# Patient Record
Sex: Male | Born: 1990 | Race: White | Hispanic: No | Marital: Single | Smoking: Former smoker
Health system: Southern US, Academic
[De-identification: ages and names within clinical notes are randomized; demographics above are authoritative.]

## PROBLEM LIST (undated history)

## (undated) DIAGNOSIS — F909 Attention-deficit hyperactivity disorder, unspecified type: Secondary | ICD-10-CM

---
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
## 2010-03-14 ENCOUNTER — Encounter (FREE_STANDING_LABORATORY_FACILITY)
Admit: 2010-03-14 | Discharge: 2010-03-14 | Disposition: A | Discharge status: Home / Self Care | Payer: 59 | Attending: Family Medicine | Admitting: Family Medicine

## 2010-03-14 ENCOUNTER — Ambulatory Visit (INDEPENDENT_AMBULATORY_CARE_PROVIDER_SITE_OTHER): Payer: 59

## 2010-03-14 ENCOUNTER — Encounter (INDEPENDENT_AMBULATORY_CARE_PROVIDER_SITE_OTHER): Payer: Self-pay

## 2010-03-14 VITALS — HR 70 | Temp 98.6°F | Resp 20 | Wt 169.5 lb

## 2010-03-14 DIAGNOSIS — J029 Acute pharyngitis, unspecified: Secondary | ICD-10-CM | POA: Diagnosis present

## 2010-03-14 MED ORDER — PENICILLIN V POTASSIUM 500 MG TABLET
500.00 mg | ORAL_TABLET | Freq: Two times a day (BID) | ORAL | Status: DC
Start: 2010-03-14 — End: 2010-04-09

## 2010-03-14 NOTE — Patient Instructions (Signed)
Pharyngitis (Viral and Bacterial)  Pharyngitis is soreness (inflammation) or infection of the pharynx. It is also called a sore throat.  CAUSES  Most sore throats are caused by viruses and are part of a cold. However, some sore throats are caused by strep and other bacteria. Sore throats can also be caused by post nasal drip from draining sinuses, allergies and sometimes from sleeping with an open mouth. Infectious sore throats can be spread from person to person by coughing, sneezing and sharing cups or eating utensils.  TREATMENT  Sore throats that are viral usually last 3-4 days. Viral illness will get better without medications (antibiotics). Strep throat and other bacterial infections will usually begin to get better about 24-48 hours after you begin to take antibiotics.  HOME CARE INSTRUCTIONS   If the caregiver feels there is a bacterial infection or if there is a positive strep test, they will prescribe an antibiotic. The full course of antibiotics must be taken!! If the full course of antibiotic is not taken, you or your child may become ill again. If you or your child has strep throat and do not finish all of the medication, serious heart or kidney diseases may develop.    Drink enough water and fluids to keep your urine clear or pale yellow.    Only take over-the-counter or prescription medicines for pain, discomfort or fever as directed by your caregiver.    Get lots of rest.    Gargle with salt water ( tsp. of salt in a glass of water) as often as every 1-2 hours as you need for comfort.    Hard candies may soothe the throat if individual is not at risk for choking. Throat sprays or lozenges may also be used.   SEEK MEDICAL CARE IF:   Large, tender lumps in the neck develop.    A rash develops.    Green, yellow-brown or bloody sputum is coughed up.    You or your child has an oral temperature above 102 F (38.9 C).     Your baby is older than 3 months with a rectal temperature of 100.5 F (38.1 C) or higher for more than 1 day.   SEEK IMMEDIATE MEDICAL CARE IF:   A stiff neck develops.    You or your child are drooling or unable to swallow liquids.    You or your child are vomiting, unable to keep medications or liquids down.    You or your child has severe pain, unrelieved with recommended medications.    You or your child are having difficulty breathing (not due to stuffy nose).    You or your child are unable to fully open your mouth.    You or your child develop redness, swelling, or severe pain anywhere on the neck.    You or your child has an oral temperature above 102 F (38.9 C), not controlled by medicine.    Your baby is older than 3 months with a rectal temperature of 102 F (38.9 C) or higher.    Your baby is 29 months old or younger with a rectal temperature of 100.4 F (38 C) or higher.   MAKE SURE YOU:     Understand these instructions.    Will watch your condition.    Will get help right away if you are not doing well or get worse.   Document Released: 01/17/2005 Document Re-Released: 07/07/2009  [facility] Patient Information 2011 [place], [place].    Orders Placed This Encounter   .  Throat Culture   . Rapid Strep   . penicillin V potassium (VEETID) 500 mg Oral Tablet   . RETURN TO WORK/SCHOOL        ________________________________________________________________________  Short Term Disability and Family Medical Leave Act  [facility] does NOT provide assistance with any disability applications.  If you feel your medical condition requires you to be on disability, you will need to follow up with  Your primary care physician or a specialist.  We apologize for any inconvenience.    For Medication Prescribed by [facility]:  As an Urgent Care facility, our clinic does NOT offer prescription refills over the telephone.     If you need more of the medication one of our medical providers prescribed, you will  Either need to be re-evaluated by [place] or see your primary care physician.    ________________________________________________________________________      It is very important that we have a phone number that is the single best way to contact you in the event that we become aware of important clinical information or concerns after your discharge.  If the phone number you provided at registration is NOT this number you should inform staff and registration prior to leaving.      Your treatment and evaluation today was focused on identifying and treating potentially emergent conditions based on your presenting signs, symptoms, and history.  The resulting initial clinical impression and treatment plan is not intended to be definitive or a substitute for a full physical examination and evaluation by your primary care provider.  If your symptoms persist, worsen, or you develop any new or concerning symptoms, you need to be evaluated.      If you received x-rays during your visit, be aware that the final and formal interpretation of those films by a radiologist may occur after your discharge.  If there is a significant discrepancy identified after your discharge, we will contact you at th telephone number provided at registration.      If you received a pelvic exam, you may have cultures pending for sexually transmitted diseases.  Positive cultures are reported to the [facility] Department of Health as required by state law.  You should be contacted if you cultures are positive.  We will not contact you if they are negative.  You may contact the Health Information Management Office of [facility] to get a copy of your results.  You did NOT receive a PAP smear (the screening test for cervical).  This specific test for women is best performed by your gynecologist or primary care provider when indicated.       If you are over 49 year old, we cannot discuss your personal health information with a parent, spouse, family member, or anyone else without your express consent.  This does not include those who have legitimate access to your records and information to assist in your care under the provisions of HIPAA [facility] Portability and Accountability Act) law, or those to whom you have previously given express written consent to do so, such a legal guardian or Power of [place].      You may have received medication that may cause you to feel drowsy and/or light headed for several hours.  You may even experience some amnesia of your stay.  You should avoid operating a motor vehicle or performing any activity requiring complete alertness or coordination until you feel fully awake (approximately 24-48 hours).  Avoid alcoholic beverages.  You may  also have a dry mouth for several hours.  This is a normal side effect and will disappear as the effects of the medication wear off.      Instructions discussed with patient upon discharge by clinical staff with all questions answered.  Please call [facility] [phone] if any further questions.  Go immediately to the emergency department if any concern or worsening symptoms.        Allesandra Huebsch Clent Jacks, DO 03/14/2010, 11:52 AM

## 2010-03-14 NOTE — Progress Notes (Signed)
History of Present Illness: Terrance Irwin is a 20 y.o. male who presents to the Urgent Care today of Sore Throat   for two days, described as left side throat pain hurts to talk.  Also, patient has associated runny nose. Took mucinex D and alka seltzer.    Gets strep every year this time.  + sick contacts with viral illness. Denies cough or SOB        I reviewed and confirmed the patient's past medical history taken by the nurse or medical assistant (see cosignature of written/scanned note) with the addition of the following:    Past Medical History:      Past Medical History   Diagnosis Date   . ADHD (attention deficit hyperactivity disorder)    . Migraine Headaches 05/13/2002   . Headaches 01/07/2000         Past Surgical History:      History reviewed.  No pertinent past surgical history.    Allergies:      No Known Allergies    Problem List:    Patient Active Problem List   Diagnoses Code   . Migraine Headaches 346.10   . ADHD (Attention Deficit Hyperactivity Disorder) 314.23M   . 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 Nicotine Dependence 305.1A   . Cannabis Abuse 305.20P          Medications:      Current outpatient prescriptions   Medication Sig Dispense Refill   . penicillin V potassium (VEETID) 500 mg Oral Tablet take 1 Tab by mouth Twice daily.  20 Tab  0   . amphetamine-dextroamphetamine (ADDERALL XR) 20 mg Cp24 take 1 Cap by mouth Once a day.   1 month supply  0         Social History:      History   Social History   . Marital Status: Single     Spouse Name: N/A     Number of Children: N/A   . Years of Education: N/A   Social History Main Topics   . Smoking status: Current Everyday Smoker -- 0.5 packs/day   . Smokeless tobacco: Not on file   . Alcohol Use: No   . Drug Use: Yes      marijuana   . Sexually Active: Not on file   Other Topics Concern   . Not on file   Social History Narrative   . No narrative on file         Family History: No significant family history.    Family History    Problem Relation Age of Onset   . Hypertension     . Cancer           Review of Systems:  General: no fever, no myalgias and no fatigue  ENT:  no otalgia right and no otalgia left  Pulmonary: no dry cough    Physical Exam:  Vital signs:   Filed Vitals:    03/14/10 1129   Pulse: 70   Temp: 37 C (98.6 F)   Resp: 20   Weight: 76.9 kg (169 lb 8.5 oz)   SpO2: 98%       General: Well appearing and No acute distress  Eyes: Normal lids/lashes and normal conjunctiva  ENT: normal EAC's, normal TM's, MMM and mild pharyngeal erythema and PND  Neck: supple and no lymphadenopathy  Pulmonary: clear to auscultation bilaterally, no wheezes, no rales, no rhonchi and overall decreased breath sounds  Cardiovascular: regular rate/rhythm, normal  S1/S2 and no murmur/rub/gallop  Skin: warm/dry and no rash  Psychiatric: appropriate affect and behavior        Course: Pt discharged in good condition.    Point-of-care testing:          Rapid Strep: Negative  POCT Results:           Rapid Strep: Negative  Throat culture sent: Yes  Initials: KC      Glorene Leitzke Clent Jacks, DO 03/14/2010, 12:02 PM      Radiography:     Electrocardiography:     Differential diagnosis: viral, sinus drainage, abscess, strep    Assessment: 1. Pharyngitis (89Y)        Plan:  Orders Placed This Encounter   . Throat Culture   . Rapid Strep   . penicillin V potassium (VEETID) 500 mg Oral Tablet   . RETURN TO WORK/SCHOOL     Ibuprofen 600mg  prn. Advised to quit smoking           Plan was discussed and patient verbalized understanding.  If symptoms are worsening or not improving the patient should return to the Urgent Care for further evaluation.    Nichole Neyer Clent Jacks, DO 03/14/2010, 11:59 AM

## 2010-03-16 LAB — THROAT CULTURE, BETA HEMOLYTIC STREPTOCOCCUS

## 2010-04-09 ENCOUNTER — Encounter (HOSPITAL_COMMUNITY): Payer: Self-pay

## 2010-04-09 ENCOUNTER — Emergency Department (EMERGENCY_DEPARTMENT_HOSPITAL): Payer: 59

## 2010-04-09 ENCOUNTER — Emergency Department
Admission: EM | Admit: 2010-04-09 | Discharge: 2010-04-09 | Disposition: A | Discharge status: Home / Self Care | Payer: 59 | Attending: Emergency Medicine-WVUH | Admitting: Emergency Medicine-WVUH

## 2010-04-09 DIAGNOSIS — F17200 Nicotine dependence, unspecified, uncomplicated: Secondary | ICD-10-CM | POA: Insufficient documentation

## 2010-04-09 DIAGNOSIS — S61209A Unspecified open wound of unspecified finger without damage to nail, initial encounter: Secondary | ICD-10-CM | POA: Insufficient documentation

## 2010-04-09 DIAGNOSIS — W268XXA Contact with other sharp object(s), not elsewhere classified, initial encounter: Secondary | ICD-10-CM | POA: Insufficient documentation

## 2010-04-09 MED ORDER — CEPHALEXIN 500 MG CAPSULE
500.00 mg | ORAL_CAPSULE | Freq: Two times a day (BID) | ORAL | Status: DC
Start: 2010-04-09 — End: 2011-09-29
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
## 2010-04-09 NOTE — ED Nurses Note (Signed)
Pt was discharged by pa.
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
## 2011-07-15 ENCOUNTER — Ambulatory Visit (HOSPITAL_COMMUNITY): Payer: Self-pay | Admitting: Psychiatry

## 2011-07-15 NOTE — Telephone Encounter (Signed)
Message copied by Creta Levin on Fri Jul 15, 2011  3:40 PM  ------       Message from: Purvis Sheffield [facility])       Created: Fri Jul 15, 2011  2:24 PM         >> Mallie Snooks 07/15/2011 11:14 AM       Melanie, patient is former patient of Dr. Okey Dupre.  He is wanting to get back in to be seen for ADHD & to get back on medication.  Mom is [employer] employee.  I've updated his information.  Thank you

## 2011-07-15 NOTE — Telephone Encounter (Signed)
 Case manager called patient and scheduled him for an intake appointment on Monday 08/08/11 at 1:45 pm with Dr. Marliss.  Case manager mailed patient a new patient letter and appointment card.  07/15/2011  Sheneika Walstad Lorraine Calloway Andrus, DISCHARGE PL

## 2011-07-20 ENCOUNTER — Ambulatory Visit (HOSPITAL_COMMUNITY): Payer: Self-pay | Admitting: Psychiatry

## 2011-07-20 NOTE — Telephone Encounter (Signed)
Case manager called patient and rescheduled him for a sooner intake appointment on Tuesday 08/02/11 at 9:00 am with Dr. Claudia Desanctis.  Patient asked if there were any sooner appointments and case manager told him there were not any appointments available in June unless someone cancels.  Case manager said she would call him if someone were to cancel an appointment before 08/02/11.  Case manager mailed patient a new appointment card and new patient letter.  07/20/2011  Aaralynn Shepheard Lorraine Vannesa Abair, DISCHARGE PL

## 2011-07-20 NOTE — Telephone Encounter (Signed)
 Message copied by BERNADENE PLEASANT JEAN on Wed Jul 20, 2011 10:37 AM  ------       Message from: TANDA POOL       Created: Tue Jul 19, 2011  1:58 PM         >> POOL TANDA 07/19/2011 01:58 PM       Patient is currently scheduled for 7/8 but is calling to see if he can get in any sooner. He's having anger issues and states he 'goes off on everyone.  He's going through a lot of stuff at the moment and feels he needs seen. He believes Andrea had helped him get past the long waiting period. I told hm this was most likely the soonest you had but said I'd send a message.

## 2011-08-02 ENCOUNTER — Encounter (FREE_STANDING_LABORATORY_FACILITY)
Admit: 2011-08-02 | Discharge: 2011-08-02 | Disposition: A | Discharge status: Home / Self Care | Payer: 59 | Attending: Psychiatry | Admitting: Psychiatry

## 2011-08-02 ENCOUNTER — Ambulatory Visit (HOSPITAL_COMMUNITY): Payer: 59 | Admitting: Psychiatry

## 2011-08-02 ENCOUNTER — Ambulatory Visit (INDEPENDENT_AMBULATORY_CARE_PROVIDER_SITE_OTHER): Payer: 59 | Admitting: Psychiatry

## 2011-08-02 VITALS — BP 121/72 | HR 70 | Ht 68.15 in | Wt 180.8 lb

## 2011-08-02 DIAGNOSIS — F909 Attention-deficit hyperactivity disorder, unspecified type: Secondary | ICD-10-CM | POA: Diagnosis present

## 2011-08-02 LAB — URINALYSIS, MACROSCOPIC AND MICROSCOPIC
BILIRUBIN: NEGATIVE
BLOOD: NEGATIVE
GLUCOSE: NEGATIVE mg/dL
KETONES: NEGATIVE mg/dL
LEUKOCYTES: NEGATIVE
NITRITE: NEGATIVE
PH URINE: 7 (ref 5.0–8.0)
RBC'S: NONE SEEN /HPF (ref 0–4)
SPECIFIC GRAVITY, URINE: 1.02 (ref 1.005–1.030)
UROBILINOGEN: NORMAL mg/dL
WBC'S: <1 /HPF (ref 0–1)

## 2011-08-02 LAB — ALT (SGPT): ALT (SGPT): 28 U/L (ref 7–55)

## 2011-08-02 LAB — AST (SGOT): AST (SGOT): 33 U/L (ref 8–48)

## 2011-08-02 LAB — CANNABINOID, UR (CRH): CANNABINOID, URINE: POSITIVE — AB

## 2011-08-02 LAB — ETHANOL, SERUM: ETHANOL, SERUM: NOT DETECTED mg/dL

## 2011-08-02 MED ORDER — DEXTROAMPHETAMINE-AMPHETAMINE ER 20 MG 24HR CAPSULE,EXTEND RELEASE
20.00 mg | ORAL_CAPSULE | Freq: Every morning | ORAL | Status: DC
Start: 2011-08-02 — End: 2011-08-18

## 2011-08-02 NOTE — Progress Notes (Signed)
08/02/11 1500   Drug Screen Results   Amphetamine (AMP) Negative   Barbiturates (BAR) Negative   Bupenorphine (BUP) Negative   Benzodiazepine (BZO) Negative   Cocaine (COC) Negative   Methamphetamine (mAMP) Negative   Methadone (MTD) Negative   Opiates (OPI) Negative   Oxycodone (OXY) Negative   Marijuana (THC) Positive   Temperature within range? yes   Observed no   Tester cschaefer   Witness dray   Physician hartzell/berry   Lot # [id number]   Expiration Date 10/14   Internal Control Valid yes   Initials cs
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
## 2011-08-05 LAB — ADULTERANTS SURVEY
CREATININE: 173.8
OXIDANTS: NEGATIVE
PH: 8.1
SPECIFIC GRAVITY: 1.016

## 2011-08-06 LAB — CARBOXY - TETRAHYDROCANNABINOL (THC) CONFIRMATION, URINE
IMMUNOASSAY SCREEN: POSITIVE
INTERPRETATION: POSITIVE
THC CARBOXYLIC ACID-BY GC/MS: 43

## 2011-08-08 ENCOUNTER — Ambulatory Visit (HOSPITAL_COMMUNITY): Payer: 59 | Admitting: Psychiatry

## 2011-08-18 ENCOUNTER — Ambulatory Visit (HOSPITAL_COMMUNITY): Payer: Self-pay | Admitting: Psychiatry

## 2011-08-18 ENCOUNTER — Ambulatory Visit (INDEPENDENT_AMBULATORY_CARE_PROVIDER_SITE_OTHER): Payer: 59 | Admitting: Psychiatry

## 2011-08-18 VITALS — BP 120/60 | HR 60 | Wt 181.2 lb

## 2011-08-18 MED ORDER — DEXTROAMPHETAMINE-AMPHETAMINE ER 20 MG 24HR CAPSULE,EXTEND RELEASE
20.0000 mg | ORAL_CAPSULE | Freq: Every morning | ORAL | Status: DC
Start: 2011-08-18 — End: 2011-10-06

## 2011-08-18 NOTE — Telephone Encounter (Signed)
Message copied by Cherylann Banas on Thu Aug 18, 2011  2:45 PM  ------       Message from: ENOFF, LINDA       Created: Thu Aug 18, 2011  2:35 PM         >> Bonita Quin ENOFF 08/18/2011 02:35 PM       Dr. Claudia Desanctis,               Pt called and said that the pharmacy will not fill the script for his adderall because he just got it filled on 7.5.13 - could you call the pharmacy and explain that they were lost and he needs a new refill. ?                   [facility] - [place], [place] - [address]         [address] [facility] [place] [address]         Phone: [phone] Fax: [phone]

## 2011-08-18 NOTE — Progress Notes (Addendum)
 [facility]         Psychiatric Progress Note    Name: Terrance Irwin   MRN: [id number]  DOB: Aug 08, 1990  Date of Service: 08/18/2011    Chief Complaint   Patient presents with   . ADHD   . Substance Abuse        Subjective: Patient presents back to clinic today for follow up of ADHD, seen for intake 2 weeks ago, former patient of Dr. Charlotta with h/o ADHD, ODD, sub abuse. He was started on Adderall XR in accordance with plan to dc THC use and cut down on OH consumption as well. Urine screen was + for THC (level 43) at that time. He reports he has been doing OK but he states he only was able to take the Adderall 3 days and then lost it when he went camping at [facility] with his parents that weekend. He says he noticed an improvement in his focus and felt calmer the days he took it. He denies any more THC use since last appt. He has drank a few days since visit, no more than 1-2 beers per day. 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 Denies med side effects.    Mental Status Exam: Javion Holmer appears in no distress and  stated age with fair hygiene. He is dressed in Transport planner and work clothes. He is oriented fully. Attention is fair. Speech is normal. His nails are noticed to be very short from being bitten. He describes his mood as alright and affect is mildly anxious and mood congruent.  He exhibits no psychomotor changes. He endorses no suicidal ideation and no homicidal ideation. His thought process is linear and perception appears unaltered. Insight is fair.    Neurologic Exam:     Gait: Steady  Sensation: Appears to be grossly intact unless otherwise noted  Motor: Grossly Intact  Orientation: Fully oriented to person, place, time and situation    Vitals:    BP 120/60  Pulse 60  Wt 82.2 kg (181 lb 3.5 oz)      Current Psych Medications: Adderall XR 20 mg  daily    Home medications: Other home medications reviewed and changes noted:     Recent Abnormal or Pertinent Lab/Test Results: URINE DRUG SCREEN reviewed from previous visit as mentioned above    Collateral History Obtained from Family/Friend/Caregiver: NA    Assessment:    Axis I: ADHD, ODD per history, Alcohol  abuse, Cannabis Abuse, rule out anxiety disorder NOS  Axis II: Defer  Axis III: None  Axis IV: poor coping skills; family and relationship stressors  Axis V: GAF 65    Suicide Risk Assessment: Low    Plan:    1. Discussed need to not lose controlled substance script and that I only allow one such occurrence before I will no longer prescribe these substances to a patient. He voiced understanding. He plans to have his mother keep the bottle and dispense to him  2. Continue to encourage abstinence from other substances with that being a pre-requisite to continue treatment with stimulants  3. Expolre possibility of anxiety disorder further  4. RTC 2-3 weeks  5. URINE DRUG SCREEN and Vitals today    -Call with questions or concerns  -Will report to ED or call 911 immediately if develops thoughts of self harm or SI  [name], MD        I saw and examined the patient.  I reviewed the resident's note.  I agree with the findings and plan of care as documented in the resident's note.  Any exceptions/additions are edited/noted.    Lynwood Mages, MD 08/23/2011, 12:51 PM

## 2011-08-18 NOTE — Telephone Encounter (Signed)
Left message with pharmacy to authorize one time early refill of Adderall XR and to notify patient.      Cherylann Banas, MD 08/18/2011, 2:46 PM

## 2011-08-18 NOTE — Progress Notes (Addendum)
08/18/11 1300   Drug Screen Results   Amphetamine (AMP) Negative   Barbiturates (BAR) Negative   Bupenorphine (BUP) Negative   Benzodiazepine (BZO) Negative   Cocaine (COC) Negative   Methamphetamine (mAMP) Negative   Methadone (MTD) Negative   Opiates (OPI) Negative   Oxycodone (OXY) Negative   Marijuana (THC) Negative   Temperature within range? yes   Observed no   Tester cschaefer   Witness lori   Physician hartzell/sullivan   Lot # [id number]   Expiration Date 10/14   Internal Control Valid yes   Initials cs          I saw and examined the patient.  I reviewed the resident's note.  I agree with the findings and plan of care as documented in the resident's note.  Any exceptions/additions are edited/noted.    Renee Pain, MD 08/23/2011, 12:41 PM

## 2011-09-02 ENCOUNTER — Ambulatory Visit (HOSPITAL_COMMUNITY): Payer: Self-pay

## 2011-09-06 ENCOUNTER — Encounter (HOSPITAL_COMMUNITY): Payer: 59 | Admitting: Psychiatry

## 2011-09-09 ENCOUNTER — Encounter (HOSPITAL_COMMUNITY): Payer: 59 | Admitting: Psychiatry

## 2011-09-27 ENCOUNTER — Telehealth (HOSPITAL_COMMUNITY): Payer: Self-pay | Admitting: Psychiatry

## 2011-09-27 NOTE — Telephone Encounter (Signed)
Message copied by Cherylann Banas on Tue Sep 27, 2011  4:20 PM  ------       Message from: Syliva Overman       Created: Tue Sep 27, 2011  1:05 PM       Regarding: RE: Appointment       Contact: [phone]         I think it would be helpful to tell the patient that yourself when you call him.               Thanks       RJW       ----- Message -----          From: Cherylann Banas, MD          Sent: 09/27/2011  12:58 PM            To: Wyvonnia Dusky. Izetta Dakin, MD       Subject: RE: Appointment                                            I have called this guy multiple times in past, My turn around is 24-48 hours for phone calls, can this please be passed on to patients in the future? Thank you. Will try again later today.              Jasper Riling              ----- Message -----          From: Syliva Overman., MD          Sent: 09/27/2011  11:16 AM            To: Cherylann Banas, MD       Subject: Annell Greening: Appointment                                            Call this patient please       ----- Message -----          From: Elder Love          Sent: 09/27/2011  10:54 AM            To: Wyvonnia Dusky. Izetta Dakin, MD       Subject: FW: Appointment                                            Pt says he never gets a return call back       ----- Message -----          From: Elder Love          Sent: 09/26/2011   9:03 AM            To: Cherylann Banas, MD       Subject: Appointment                                                Needs you to call him ASAP

## 2011-09-27 NOTE — Telephone Encounter (Signed)
I've attempted to contact this gentleman multiple times and I get no answer and  "We're sorry but this person has a voice mailbox that has not been set up yet". If patient calls back again it should be stressed we return calls as soon as possible and if he feels something is an emergency he needs to go to ED. Thanks.      Cherylann Banas, MD 09/27/2011, 4:21 PM

## 2011-09-28 ENCOUNTER — Telehealth (HOSPITAL_COMMUNITY): Payer: Self-pay | Admitting: Psychiatry

## 2011-09-28 NOTE — Telephone Encounter (Signed)
Drinking has increased recently to up to 2-30packs per day and also admits to taking 2 Adderall tabs per day instead of one prescribed. Discussed need for detox which he desires. Plans to come to ED tomorrow as he cannot tonight. Called [facility] and placed bed on hold. Dr. Allyson Sabal notified of possibility of patient being admitted tomorrow. No current SI "want to get help, cant keep doing this to myself". Will notify me tomorrow when he heads to ED.      Cherylann Banas, MD 09/28/2011, 4:46 PM

## 2011-09-28 NOTE — Telephone Encounter (Signed)
 Message copied by LASANDRA COUGH on Wed Sep 28, 2011  4:44 PM  ------       Message from: SANDIE ROSELLA       Created: Wed Sep 28, 2011  9:39 AM       Regarding: RE: Patient Phone calls         Yes he did call back but didn't want to leave a message but he did say that he is still waiting on your call. He did not explain anything else.       ----- Message -----          From: LASANDRA COUGH, MD          Sent: 09/28/2011   9:10 AM            To: ROSELLA SANDIE       Subject: RE: Patient Phone calls                                    No problem thanks for your help! Did he call back again this morning?              ----- Message -----          From: SANDIE ROSELLA          Sent: 09/28/2011   7:31 AM            To: COUGH LASANDRA, MD       Subject: RE: Patient Phone calls                                    This patient said that he never gets a call back but if his mailbox is not set up no message can be left. So sorry for the inconvenience.        ----- Message -----          From: LASANDRA COUGH, MD          Sent: 09/27/2011   4:15 PM            To: ROSELLA SANDIE, Reyes CHRISTELLA Flatter, MD, #       Subject: Patient Phone calls                                        I've attempted to contact this gentleman multiple times and I get Were sorry but this person has a voice mailbox that has not been set up yet. It is expected that I can get back with patients within 24-48 hours at the latest for phone questions. Please try to clarify in the future if question is an emergency, ie safety/SI, etc , as call ASAP is not helpful and makes things that may not be urgent sound as such. Also, if patients continue to call back multiple times per day, please let them know that I am notified when they call each time and this is not necessary. Thanks a lot for your help!              -Adina LASANDRA

## 2011-09-29 ENCOUNTER — Encounter (HOSPITAL_COMMUNITY): Payer: Self-pay

## 2011-09-29 ENCOUNTER — Emergency Department (EMERGENCY_DEPARTMENT_HOSPITAL): Payer: 59

## 2011-09-29 ENCOUNTER — Inpatient Hospital Stay
Admission: RE | Admit: 2011-09-29 | Discharge: 2011-10-06 | DRG: 897 | Disposition: A | Discharge status: Home / Self Care | Payer: 59 | Source: Ambulatory Visit | Attending: Psychiatry | Admitting: Psychiatry

## 2011-09-29 ENCOUNTER — Emergency Department (HOSPITAL_COMMUNITY)
Admission: EM | Admit: 2011-09-29 | Discharge: 2011-09-29 | Disposition: A | Discharge status: Other Acute Inpatient Hospital | Payer: 59 | Source: Ambulatory Visit | Attending: Emergency Medicine | Admitting: Emergency Medicine

## 2011-09-29 ENCOUNTER — Inpatient Hospital Stay (HOSPITAL_COMMUNITY): Payer: 59 | Admitting: Psychiatry

## 2011-09-29 DIAGNOSIS — F909 Attention-deficit hyperactivity disorder, unspecified type: Secondary | ICD-10-CM | POA: Diagnosis present

## 2011-09-29 DIAGNOSIS — F1210 Cannabis abuse, uncomplicated: Secondary | ICD-10-CM | POA: Diagnosis present

## 2011-09-29 DIAGNOSIS — F17200 Nicotine dependence, unspecified, uncomplicated: Secondary | ICD-10-CM | POA: Diagnosis present

## 2011-09-29 LAB — CBC/DIFF
BASOPHILS: 0 %
BASOS ABS: 0 THOU/uL (ref 0.0–0.2)
EOS ABS: 0.1 THOU/uL (ref 0.0–0.5)
EOSINOPHIL: 1 %
HCT: 46.6 % (ref 36.7–47.0)
HGB: 16 g/dL (ref 12.5–16.3)
LYMPHOCYTES: 24 %
LYMPHS ABS: 2.2 THOU/uL (ref 1.5–3.5)
MCH: 29.1 pg (ref 27.4–33.0)
MCHC: 34.3 g/dL (ref 32.5–35.8)
MCV: 84.9 fL (ref 78–100)
MONOCYTES: 10 %
MONOS ABS: 0.9 THOU/uL (ref 0.3–1.0)
MPV: 8.3 fL (ref 7.5–11.5)
PLATELET COUNT: 228 THOU/uL (ref 140–450)
PMN ABS: 5.7 THOU/uL (ref 2.0–6.5)
PMN'S: 65 %
RBC: 5.5 MIL/uL (ref 4.06–5.63)
RDW: 13 % (ref 12.0–15.0)
WBC: 8.9 THOU/uL (ref 3.5–11.0)

## 2011-09-29 LAB — DRUG SCREEN, HIGH OPIATE CUTOFF, NO CONFIRMATION, URINE
AMPHETAMINE, URINE: NEGATIVE
BARBITURATE, URINE: NEGATIVE
BENZODIAZEPINE,URINE: NEGATIVE
BUPRENORPHINE, URINE QL: NEGATIVE
CANNABINOID (THC), URINE: NEGATIVE
COCAINE METAB. URINE: NEGATIVE
METHADONE, URINE: NEGATIVE
OPIATE, URINE: NEGATIVE
PHENCYCLIDINE, URINE: NEGATIVE
PROPOXYPHENE, URINE: NEGATIVE

## 2011-09-29 LAB — URINALYSIS, MACROSCOPIC
BILIRUBIN: NEGATIVE
BLOOD: NEGATIVE
GLUCOSE: NEGATIVE mg/dL
KETONES: NEGATIVE mg/dL
LEUKOCYTES: NEGATIVE
NITRITE: NEGATIVE
PH URINE: 6.5 (ref 5.0–8.0)
SPECIFIC GRAVITY, URINE: 1.018 (ref 1.005–1.030)
UROBILINOGEN: NORMAL mg/dL

## 2011-09-29 LAB — BASIC METABOLIC PANEL
ANION GAP: 7 mmol/L (ref 5–16)
BUN/CREAT RATIO: 11 (ref 6–22)
BUN: 8 mg/dL (ref 8–26)
CALCIUM: 9.5 mg/dL (ref 8.5–10.4)
CARBON DIOXIDE: 28 mmol/L (ref 23–33)
CHLORIDE: 104 mmol/L (ref 96–111)
CREATININE: 0.7 mg/dL (ref 0.62–1.27)
ESTIMATED GLOMERULAR FILTRATION RATE: >59 ml/min/1.73m2 (ref >59)
GLUCOSE,NONFAST: 109 mg/dL (ref 65–139)
POTASSIUM: 3.7 mmol/L (ref 3.5–5.1)
SODIUM: 139 mmol/L (ref 136–145)

## 2011-09-29 LAB — URINALYSIS, MICROSCOPIC
RBC'S: <1 /HPF (ref 0–4)
WBC'S: <1 /HPF (ref 0–1)

## 2011-09-29 LAB — THYROID STIMULATING HORMONE WITH FREE T4 REFLEX: THYROID STIMULATING HORMONE WITH FREE T4 REFLEX: 0.573 u[IU]/mL (ref 0.300–5.900)

## 2011-09-29 LAB — ETHANOL, SERUM/PLASMA: ETHANOL, SERUM: NOT DETECTED mg/dL

## 2011-09-29 MED ORDER — NICOTINE 21 MG/24 HR DAILY TRANSDERMAL PATCH
21.0000 mg | MEDICATED_PATCH | TRANSDERMAL | Status: DC
Start: 2011-09-29 — End: 2011-10-01
  Administered 2011-09-29 – 2011-10-01 (×6): 21 mg via TRANSDERMAL
  Filled 2011-09-29 (×3): qty 1

## 2011-09-29 MED ORDER — NICOTINE 10 MG INHALATION CARTRIDGE
10.0000 mg | CARTRIDGE | RESPIRATORY_TRACT | Status: DC | PRN
Start: 2011-09-29 — End: 2011-10-06
  Administered 2011-09-29: 10 mg via RESPIRATORY_TRACT
  Filled 2011-09-29 (×4): qty 6

## 2011-09-29 MED ORDER — LORAZEPAM 1 MG TABLET
1.00 mg | ORAL_TABLET | ORAL | Status: DC | PRN
Start: 2011-09-29 — End: 2011-10-06
  Administered 2011-10-03 – 2011-10-04 (×5): 1 mg via ORAL
  Filled 2011-09-29 (×5): qty 1

## 2011-09-29 MED ORDER — LORAZEPAM 1 MG TABLET
1.0000 mg | ORAL_TABLET | Freq: Three times a day (TID) | ORAL | Status: AC
Start: 2011-10-02 — End: 2011-10-03
  Administered 2011-10-01 – 2011-10-03 (×6): 1 mg via ORAL
  Filled 2011-09-29 (×6): qty 1

## 2011-09-29 MED ORDER — MULTIVITAMIN TABLET
1.0000 | ORAL_TABLET | Freq: Every day | ORAL | Status: DC
Start: 2011-09-29 — End: 2011-10-06
  Administered 2011-09-29 – 2011-09-30 (×2): 1 via ORAL
  Administered 2011-10-01: 0 via ORAL
  Administered 2011-10-02 – 2011-10-03 (×2): 1 via ORAL
  Administered 2011-10-04: 0 via ORAL
  Administered 2011-10-05 – 2011-10-06 (×2): 1 via ORAL
  Filled 2011-09-29 (×8): qty 1

## 2011-09-29 MED ORDER — LORAZEPAM 0.5 MG TABLET
0.5000 mg | ORAL_TABLET | Freq: Three times a day (TID) | ORAL | Status: AC
Start: 2011-10-04 — End: 2011-10-05
  Administered 2011-10-04 (×2): 0.5 mg via ORAL
  Administered 2011-10-04: 0 mg via ORAL
  Administered 2011-10-05 (×3): 0.5 mg via ORAL
  Filled 2011-09-29 (×6): qty 1

## 2011-09-29 MED ORDER — LORAZEPAM 2 MG/ML INJECTION SOLUTION
1.00 mg | INTRAMUSCULAR | Status: DC
Start: 2011-09-29 — End: 2011-09-29

## 2011-09-29 MED ORDER — NICOTINE 10 MG INHALATION CARTRIDGE
10.00 mg | CARTRIDGE | RESPIRATORY_TRACT | Status: DC | PRN
Start: 2011-09-29 — End: 2011-09-29

## 2011-09-29 MED ORDER — LORAZEPAM 2 MG TABLET
2.0000 mg | ORAL_TABLET | Freq: Three times a day (TID) | ORAL | Status: AC
Start: 2011-09-30 — End: 2011-10-01
  Administered 2011-09-30 – 2011-10-01 (×6): 2 mg via ORAL
  Filled 2011-09-29 (×6): qty 1

## 2011-09-29 MED ORDER — LORAZEPAM 1 MG TABLET
1.0000 mg | ORAL_TABLET | ORAL | Status: DC | PRN
Start: 2011-09-29 — End: 2011-09-29
  Filled 2011-09-29: qty 1

## 2011-09-29 MED ORDER — THIAMINE HCL (VITAMIN B1) 100 MG TABLET
100.0000 mg | ORAL_TABLET | Freq: Every day | ORAL | Status: DC
Start: 2011-09-29 — End: 2011-10-06
  Administered 2011-09-29 – 2011-09-30 (×2): 100 mg via ORAL
  Administered 2011-10-01: 0 mg via ORAL
  Administered 2011-10-02 – 2011-10-06 (×5): 100 mg via ORAL
  Filled 2011-09-29 (×8): qty 1

## 2011-09-29 MED ORDER — NICOTINE 10 MG INHALATION CARTRIDGE
CARTRIDGE | RESPIRATORY_TRACT | Status: DC
Start: 2011-09-29 — End: 2011-09-29
  Filled 2011-09-29: qty 6

## 2011-09-29 NOTE — Progress Notes (Addendum)
 Patient arrived on unit and medications reconciled. Orders placed for extended Ativan  taper, and adderal held.  For further details on patient, please refer to H & P.    Dorise ORN Lockmer, DO 09/29/2011, 6:45 PM

## 2011-09-29 NOTE — Ancillary Notes (Signed)
Patient is agitated and pacing the floor, states that he is ready to leave and he didn't know that this took so long.

## 2011-09-29 NOTE — Ancillary Notes (Signed)
 Patient was given nicotine  replacement but stated 'that thing is nasty and threw it in the garbage. Patient is now sitting in recliener listening to music.

## 2011-09-29 NOTE — ED Attending Handoff Note (Signed)
Patient checked out to me by prior attending. The plan for evaluation, management, and disposition for the patient was unchanged from that of the prior attending. Exceptions are outlined subsequently.

## 2011-09-29 NOTE — Ancillary Notes (Signed)
Patient received his dinner tray.  

## 2011-09-29 NOTE — Consults (Addendum)
[facility] Medicine and Psychiatry   Consultation/Liaison Service  History and Physical Evaluation    Terrance Irwin  [id number]  Aug 04, 1990    Date of Service: 09/29/2011    Reason for Consult: Alcohol detox  Requesting MD: Dr. Shari Heritage  Information Obtained from: Patient  Chief Complaint:  "I want to quit drinking"    Assessment:  AXIS I:  Alcohol use disorder; ADHD; Nicotine Dependence; Cannabis use Disorder; r/o Stimulant use disorder  AXIS II:  Deferred  AXIS III: Migraine headaches  AXIS IV: multiple substance use  AXIS V:  GAF:  55    Recommendations:   - Please place consult order  - Please obtain the following labs/studies CBC/diff, BMP, TSH, LFTs (if alcohol dependence or abuse), BAL, UDS, UA.  - Patient is agreeable to go to [facility] for Alcohol detox and therapy. Bed is on hold for patient. Care management will be made aware.     History of Present Illness:    Terrance Irwin is a 21 y.o. male who presents to [facility] ED with a 9 month history of daily alcohol use. He has been drinking 1-2 30 cases of beer per day since November of last year. He states that he has been drinking since he was 21 years old and over the past 5 years has built up to drinking daily. The patient feels the need to cut back his drinking but his biggest motivation to quit drinking alcohol is his family. He also mentioned that he recently quit his job (1 month ago) because "I just felt like drinking an didn't want to go to work". Patient states he starts drinking from the time he wakes up in the morning and continues throughout the entire day and into the night. He denies having legal issues related to his drinking. He says his family constantly tells him to quit his drinking and that is why he decided to come get help. Mr. Moccia follows at [facility] for his ADHD and had a follow up appointment this morning. He receives Adderall for his ADHD and admits to using more than prescribed when he drinks.     Regarding mood symptoms, Does not endorse any feelings of depression.Denies any SI or HI    Regarding anxiety symptoms, States he sometimes has anxiety and bites his nails. Although he denies drinking alcohol to self medicate for anxiety    Regarding psychotic symptoms, Denies any AVH    Regarding PTSD, Denies    Regarding substance abuse, Daily alcohol use, cannabis abuse, nicotine dependence, Adderall abuse.    Past Psychiatric History:    Current Outpatient: [facility] for [facility]  Past Outpatient: Denies  Inpatient: Denies  Medication Trials: Denies  Suicide Attempts: Denies    Past Medical History: No PMH  Past Medical History   Diagnosis Date   . ADHD (attention deficit hyperactivity disorder)    . Migraine Headaches 05/13/2002   . Headaches 01/07/2000     History of Seizures:  No h/o seizures   History of Closed Head Injuries:  No h/o CHI    Past Surgical History: No PSH    No past surgical history on file.    Current Outpatient Medications: No current outpatient medications   Current Facility-Administered Medications   Medication   . nicotine (NICOTROL) 10 mg oral inhalation cartridge   . nicotine (NICOTROL) oral inhalation cartridge ---Jaymes Graff     Current Outpatient Prescriptions   Medication Sig   .  dextroamphetamine-amphetamine (ADDERALL XR) 20 mg Oral Capsule, Sust. Release 24 hr Take 1 Cap (20 mg total) by mouth Every morning   . DISCONTD: cephALEXin (KEFLEX) 500 mg Oral Capsule take 1 Cap by mouth Twice daily.   Marland Kitchen amphetamine-dextroamphetamine (ADDERALL XR) 20 mg Cp24 take 1 Cap by mouth Once a day.        Allergies: NKDA  No Known Allergies    Social History:  Patient lives in [place] with his family. 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 He drinks 1-2 30 cases of beer daily and smokes marijuana.     Family History:   Denies family history of psychiatric illness.    Review of Systems:   Constitutional: Negative for fever, chills  Eyes: Negative for blurred vision, double vision  HENT: Negative for headache, ear pain, rhinorrhea, or sore throat  Cardiac/Vascular: Negative for chest pain, palpitations, swelling/edema  Respiratory: Negative for cough, shortness of breath  GI: Negative for nausea, vomiting, diarrhea, constipation  GU: Negative for hematuria, dysuria  MS: Negative for myalgias or arthralgias  Integument: Negative for rash, lacerations, abrasions  Neurologic: Negative for numbness, tingling, weakness    Physical Exam:  Filed Vitals:    09/29/11 1302 09/29/11 1510   BP: 118/78 148/92   Pulse: 84 76   Temp: 36.8 C (98.2 F) 36.9 C (98.4 F)   Resp: 20 18   SpO2: 99% 99%     General: No acute distress,   Eyes: PER, extraocular muscles intact, sclera white, conjunctiva pink  HENT:    Head: normocephalic atraumatic   Ears: auricle normal in appearance without tenderness to palpation   Nose: septum midline, no sinus tenderness    Throat: dentition good, pharynx without exudate, tongue midline  Cardiovascular: Regular, Rate and Rhythm no murmurs appreciated; No dependent edema  Respiratory: Lungs clear to auscultation bilaterally  Abdominal/GI: Bowel sounds normal, Non-tender to palpation  Musculoskeletal: No gross joint deformities, good active range of motion  Integument: Nails without clubbing or edema; No lacerations or abrasions noted  Neurologic: CN II-XII intact    Mental Status Exam:  Appearance: Casually dressed, well groomed, good hygiene, lying in bed  Behavior: Pleasant and cooperative  Level of consciousness: Alert  Orientation: Grossly normal  Motor: No movement abnormalities -- no longer has fine motor tremor  Speech: Regular rate, rhythm, and volume  Attention: Adequate for conversation, no excessive distractibility  Eye contact: Good  Memory: No impairment of immediate, delayed, or distant recall  Mood: "Good"  Affect: Broad and congruent with mood  Thought process: Linear and goal-directed  Thought content: Appropriate, no paranoia, no delusions  Perception: No audiovisual hallucinations  Suicide ideation: None  Homicide ideation: None    Labs:  I have reviewed all lab results.      Joice Lofts, MD 09/29/2011, 3:36 PM  Pager [phone]    I saw and examined the patient.  I reviewed the resident's note.  I agree with the findings and plan of care as documented in the resident's note.  Any exceptions/additions are edited/noted.    Cala Bradford, DO 09/30/2011, 2:07 PM

## 2011-09-29 NOTE — Nurses Notes (Signed)
 Transfer to [facility]du via ED for etoh detox. BAL and uds both negative. Pt reports drinking at least a 30 pk of beer a day. Lives with family in [place] and states he is seeking treatment for his mom. Has lost job due to etoh and does nothing but drink all day. Sees Dr. Lasandra at [facility] for ADHD- currently prescribed adderal. Reports anxiety now and then. Denies depression. Denies SI/HI and AVH. Denies withdrawal. CIWA 4. Smokes 1ppd- nrp provided. Ativan  taper to begin at midnight- no prns necessary at this time.

## 2011-09-29 NOTE — ED Attending Note (Signed)
Department of Emergency Medicine      I was physically present and directly supervised this patient's care.  Patient was seen and examined.  The resident's history and exam were reviewed.  Key elements in addition to and/or correction of that documentation are as follows:    Vitals ED Triage Vitals   Enc Vitals Group      BP (Non-Invasive) 09/29/11 1302 118/78 mmHg      Heart Rate 09/29/11 1302 84       Respiratory Rate 09/29/11 1302 20       Temperature 09/29/11 1302 36.8 C (98.2 F)      Temp src --       SpO2-1 09/29/11 1302 99 %      Weight 09/29/11 1302 81.647 kg (180 lb)      Height 09/29/11 1302 1.727 m (5\' 8" )      Head Cir --       Peak Flow --       Pain Score --       Pain Loc --       Pain Edu? --       Excl. in GC? --        This is a 21 y.o. male wanting rehab for etoh.  Drinking 6 beers per night.  adderall as well.  Last used cocaine 6 months.  No SI/HI/AH/VH.    No tremors.  No nystagmus.  CTAB, RRR.      Old records were reviewed.    Disposition: Admitted    Clinical Impression:     Encounter Diagnosis   Name Primary?   . Alcohol abuse Yes       Critical Care:  None

## 2011-09-30 LAB — AST (SGOT): AST (SGOT): 28 U/L (ref 8–48)

## 2011-09-30 LAB — ALT (SGPT): ALT (SGPT): 20 U/L (ref 7–55)

## 2011-09-30 NOTE — Nurses Notes (Signed)
Pt came on to unit after walk with staff and other pts.  Pt had a negative attitude and needed constant redirection for cursing on the unit.  Pt made statement to [facility] ([facility]), "F*ck you too man," after staff would not allow pt to visit with mother and sister while outside.  Pt made multiple staff bashing statements and stated, "I will just leave because I don't need to be here anymore."  Pt was explained the AMA process and pt walked away from the desk. No AMA requests were filled out at this time.  Pt continues to have a negative attitude on the unit.  Will continue to monitor.

## 2011-09-30 NOTE — Behavioral Health (Signed)
1:1 Session   Met with Krishiv to gather psychosocial information and 1:1. He was cooperative and friendly.  He was restless, slightly irritable with limited insight.    Maintains good eye contact and is pleasant. He is unhappy about discontinuation of Adderall.  He is unhappy about Dr. Claudia Desanctis and Dr. Alyson Ingles refusing to continue this medication.  Informed Keon medication could possible be restarted or changed with outpatient.  He had several negative, insulting comments about Dr. Claudia Desanctis and reports he is not sure if he wants to follow up with him after discharge.  He is not sure what his plans are after discharge.  When asked if he planned to return home to his mother and stepfather's house he said he was not planning to.  He was unable to identify an exact plan.  He reports he might go to [facility], where had previously been offered a job.  Judgement is limited.      Signed release for mother, Marin Shutter [phone]) is in his chart.

## 2011-09-30 NOTE — Progress Notes (Addendum)
[facility]  Psychiatric Progress Note    Date of Service: 09/30/2011  Patient's condition today:  same  Chief Complaint: alcohol use disorder    Assessment Last 24hrs:   Per Nursing Report: New admit from ED    Subjective: Patient states he is here because of his parents.  He was doing "dumb stuff" while "shit faced" and decided to get help with alcohol.  He was angry yesterday but not today.  He has grown up with his dad drinking daily.  His mom doesn't like him to drink as much as he does.  His dad said something to his mother about taking his "drinking partner" away.  His mom told him he had to come to the ED.  While he's here, he would like to get sobered up and get his "head straightened out" and get back to work.  He does not plan to continue drinking after discharge, "at least for a couple months". 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  He is anxious about speaking with the doctor even after rounds are over.    EXAM  Filed Vitals:    09/29/11 1900 09/29/11 2300 09/30/11 0730   BP: 128/96 104/74 120/80   Pulse: 84 72 76   Temp: 37.5 C (99.5 F) 36.4 C (97.5 F) 36.5 C (97.7 F)   Resp: 16 20 16    Height: 1.727 m (5\' 8" )     Weight: 81.647 kg (180 lb)     SpO2: 94% 98% 99%     Mental Status Exam:  The patient is a pleasant and cooperative, alert and oriented 21 y.o. male. He is casually groomed, wearing hospital pants, t-shirt, and hat and has good eye contact. His speech is normal and he has mildly tremulous, anxious psychomotor changes.  His face is flushed, and he is mildly agitated as he has trouble sitting still.   His mood is euthymic and his affect is congruent with broad, spontaneous range. His thoughts are goal-directed and without preoccupations. He denies any suicidal or homicidal ideations, plans, or intents and he appears to be without auditory or visual hallucinations. His insight and judgment appear fair.       Lab Results reviewed: Lab Results for Last 24 Hours:  No results found for any visits on 09/29/11 (from the past 24 hour(s)).      Current Facility-Administered Medications:  lorazepam (ATIVAN) tablet 2 mg Oral Q8HR   Followed by      lorazepam (ATIVAN) tablet 1 mg Oral Q8HR   Followed by      lorazepam (ATIVAN) tablet 0.5 mg Oral Q8HR   nicotine (NICOTROL) 10 mg oral inhalation cartridge 10 mg Inhalation Q2H PRN   nicotine (NICODERM CQ) transdermal patch (mg/24 hr) 21 mg Transdermal Q24H   thiamine-vitamin B1 (BETAXIN) tablet 100 mg Oral Daily   multivitamin tablet 1 Tab Oral Daily   lorazepam (ATIVAN) tablet 1 mg Oral Q4H PRN       Assessment:  AXIS I: Alcohol use disorder; ADHD; tobacco use disorder, cannabis use disorder   AXIS II: BIF per chart review; antisocial traits  AXIS III: Migraine headaches   AXIS IV: poor coping skills    PLAN:  AXIS I:  Alcohol use disorder   Extended Ativan taper (finishes 9/4 at 1600)   MTV, Thiamine  ADHD   Will d/c Adderall given patient's history of substance use; outpatient psychiatrist (Dr. Claudia Desanctis) does not plan to continue this  medication on an outpatient basis  Tobacco use disorder   NRT  AXIS III:  Migraines - not on any home medications    AXIS IV:  psychosocial activities, individual therapy and group therapy    Safety/Resrictions: SR  Disposition: TBD    Denyse Dago, MD 09/30/2011, 8:25 AM    I saw and examined the patient.  I reviewed the resident's note.  I agree with the findings and plan of care as documented in the resident's note.  Any exceptions/additions are edited/noted.    Cala Bradford, DO 09/30/2011, 2:07 PM

## 2011-09-30 NOTE — Care Management Notes (Signed)
 MSW received consult from Dr Gloriajean with psych; pt appropriate for DDU and is agreeable for transfer for detox.  MSW noted that pt's UA/UDS labs were pending.    Once lab results were available MSW contacted [facility] Line for bed assignment initiation.  MSW received return call from Bev at [facility] requesting updated BP and highest level of education completed.  MSW spoke with Mental Health Specialist [facility] and requested another BP measurement.  MSW spoke with pt at bedside regarding highest level of education. MSW returned call to [facility] Line with updated BO and level of education.  [facility] Line gave bed assignment to MSW.    MSW requested ED RN call report to DDU.  Pt transferred to [facility]U via [facility].
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
## 2011-09-30 NOTE — Nurses Notes (Signed)
Pt needed redirection most of evening for staff bashing [facility] on unit.  Pt redirected for calling staff "lazy", "21 yr old punk", and "worthless."  When this writer redirected pt and voiced that pt needed to have respect for staff and other peers on the unit.  Pt stated back to this writer, "I don't show respect to anyone and then walked away from the desk.  Pt continues to show a negative attitude with no insight into his treatment.  Will continue to monitor.

## 2011-10-01 LAB — DRUG SCREEN, HIGH OPIATE CUTOFF, NO CONFIRMATION, URINE
AMPHETAMINE, URINE: NEGATIVE
BARBITURATE, URINE: NEGATIVE
BENZODIAZEPINE,URINE: POSITIVE — AB
BUPRENORPHINE, URINE QL: NEGATIVE
CANNABINOID (THC), URINE: NEGATIVE
COCAINE METAB. URINE: NEGATIVE
METHADONE, URINE: NEGATIVE
OPIATE, URINE: NEGATIVE
PHENCYCLIDINE, URINE: NEGATIVE
PROPOXYPHENE, URINE: NEGATIVE

## 2011-10-01 LAB — BUPRENORPHINE (SUBOXONE), RANDOM URINE: BUPRENORPHINE, URINE QUALITATIVE: NEGATIVE

## 2011-10-01 MED ORDER — NICOTINE (POLACRILEX) 2 MG GUM
2.00 mg | CHEWING_GUM | BUCCAL | Status: DC | PRN
Start: 2011-10-01 — End: 2011-10-06

## 2011-10-01 MED ORDER — ACAMPROSATE 333 MG TABLET,DELAYED RELEASE
666.00 mg | DELAYED_RELEASE_TABLET | Freq: Three times a day (TID) | ORAL | Status: DC
Start: 2011-10-01 — End: 2011-10-06
  Administered 2011-10-01 – 2011-10-06 (×16): 666 mg via ORAL
  Filled 2011-10-01 (×4): qty 2
  Filled 2011-10-01: qty 4
  Filled 2011-10-01: qty 2
  Filled 2011-10-01: qty 4
  Filled 2011-10-01: qty 2
  Filled 2011-10-01: qty 4
  Filled 2011-10-01 (×4): qty 2

## 2011-10-01 MED ORDER — NICOTINE 21 MG/24 HR DAILY TRANSDERMAL PATCH
42.00 mg | MEDICATED_PATCH | TRANSDERMAL | Status: DC
Start: 2011-10-01 — End: 2011-10-06
  Administered 2011-10-01 – 2011-10-06 (×11): 42 mg via TRANSDERMAL
  Administered 2011-10-06: 0 mg via TRANSDERMAL
  Filled 2011-10-01 (×2): qty 2
  Filled 2011-10-01 (×2): qty 1
  Filled 2011-10-01: qty 2
  Filled 2011-10-01: qty 1
  Filled 2011-10-01: qty 2

## 2011-10-01 MED ORDER — NICOTINE 10 MG INHALATION CARTRIDGE
10.00 mg | CARTRIDGE | RESPIRATORY_TRACT | Status: DC | PRN
Start: 2011-10-01 — End: 2011-10-06
  Administered 2011-10-04: 10 mg via RESPIRATORY_TRACT
  Filled 2011-10-01: qty 6

## 2011-10-01 MED ORDER — ATOMOXETINE 18 MG CAPSULE
18.00 mg/d | ORAL_CAPSULE | Freq: Every morning | ORAL | Status: DC
Start: 2011-10-01 — End: 2011-10-03
  Administered 2011-10-01 – 2011-10-02 (×2): 18 mg via ORAL
  Administered 2011-10-03: 0 mg via ORAL
  Filled 2011-10-01 (×3): qty 1

## 2011-10-01 NOTE — Nurses Notes (Addendum)
 Childlike attempts to staff bash. He states he doesn't understand why people can get addictive stuff here and I can't get my adderrall here. Reviewed with pt that adderall is not given on the ddu secondary to it's addictive nature and tendency for abuse. Pt remains defiant with education. He states I don't like that doctor Court and that Futures trader. Reviewed with pt that it was inappropriate to use profane language and staff bash. Redirection provided. Topic was changed. Pt reports cravings for alcohol . He reports difficulty staying focused and agitation. No longer requests to leave AMA. He never did fill out ama form last evening and walked away from staff last evening when they were explaining AMA process. States he knows he needs to be here yet is dismissive of education and appears disinterested.

## 2011-10-01 NOTE — Nurses Notes (Signed)
Reviewed with pt that he will be transferred to the adult unit today secondary to his relationship with another peer on unit. Pt also was using profane language in reference to treating doctor.
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
## 2011-10-01 NOTE — Nurses Notes (Addendum)
 2030 - Pt requesting two more nicotine  patches. Pt informed they will be replaced in the am. That we recommend their removal at night due to possibility of nightmares. Pt then said, I took them off. This staff asked pt why he had taken them off, pt didn't respond.  2035 - Pt reported to this staff that he had used the bathroom and puked. Pt asked if he had flushed, pt replied, 'yes'. Pt then instructed to please not to flush next time and let staff know.  2045 - Pt reported to this staff that he had been told to tell staff when he had 'puked' again, and that he had. This staff checked bathroom. Small amount of light brown vomitus noted in toilet bowl.

## 2011-10-01 NOTE — Nurses Notes (Signed)
Pt given handouts on straterra and campral. Reviewed with pt what the indication for the medications was for. Pt to be transferred to the adult unit per Dr. Christena Deem secondary to being related to S.L.

## 2011-10-01 NOTE — Behavioral Health (Signed)
Pt attended wrap up group.  Rated mood as a 1 and says he feels this way because he was transferred to this unit and he prefers DDU.  Denied SI, HI, and AVH.  Pt reports poor appetite.  Pt says he is unable to eat, has stomach pain, and nausea.  Pt says he is used to "waking up with a beer and falling asleep with a beer" and not eating a lot of food.  Pt left wrap up group a few times and upon entering the room, he would inform this writer that he had thrown up.  Pt spent the evening in the dayroom with peers.

## 2011-10-01 NOTE — Nurses Notes (Signed)
 Pt reported feeling strange, confused. BP and HR elevated. PRN Ativan  given. Pt reported hx of drinking 60 beers/day for several months, heavy drinking beginning at age 21. VS retaken, HR 104, BP decreased to 118/78. Will continue to monitor.

## 2011-10-01 NOTE — Nurses Notes (Signed)
Pt's belongings packed up and pt transferred to adult. Pt childlike and irritable about same. "I was here for three days." Pt had explanation provided several times as to reason for transfer. Staff allowed pt to place phone call to his mother to let her know his unit was being changed. Pt used profane language with mother. She ended the call by hanging up on him.

## 2011-10-01 NOTE — Nurses Notes (Signed)
Pt rec from DDU unit at 1020 this am, angry and irritable with staff and peers and on phone with family, redirected constantl;y for profane language pt states that the way I talk, Pt asking for phone, to go outside with a staff member, snuff and then for radio in room, This nurse with another RN went in to room to speak with pt, pt with very poor insight at this time.Prompted at 1300 to attend group and pt declined, did go into group got a cup of  coffee and then left group room,

## 2011-10-01 NOTE — Nurses Notes (Signed)
Behavior and demeanor change following visit from friend. Requested repeat URINE DRUG SCREEN from patient. Patient resisted, questioned need for repeat screen, initially refused to comply. Specimen obtained under staff supervision.Dr. Claudia Desanctis notified, order placed, specimen sent to lab. Nurse from DDU reported patient had questioned her about effects of detox if someone had abused suboxone, would not confirm he was asking for himself.

## 2011-10-02 MED ORDER — IBUPROFEN 800 MG TABLET
800.0000 mg | ORAL_TABLET | Freq: Four times a day (QID) | ORAL | Status: DC | PRN
Start: 2011-10-02 — End: 2011-10-06
  Administered 2011-10-02 – 2011-10-05 (×2): 800 mg via ORAL
  Filled 2011-10-02 (×2): qty 1

## 2011-10-02 MED ORDER — SIMETHICONE 80 MG CHEWABLE TABLET
80.0000 mg | CHEWABLE_TABLET | Freq: Four times a day (QID) | ORAL | Status: DC | PRN
Start: 2011-10-02 — End: 2011-10-06
  Administered 2011-10-02: 80 mg via ORAL
  Filled 2011-10-02: qty 1

## 2011-10-02 MED ORDER — ONDANSETRON 4 MG DISINTEGRATING TABLET
4.0000 mg | ORAL_TABLET | Freq: Once | ORAL | Status: AC
Start: 2011-10-03 — End: 2011-10-03
  Administered 2011-10-03: 4 mg via SUBLINGUAL
  Filled 2011-10-02: qty 1

## 2011-10-02 NOTE — Nurses Notes (Signed)
 Continued to c/o left sided kidney pain.  Reported pain like someone stabbing me over and over.  Denied N/V.  Afebrile.  VSS w/ CIWA=7.  Ativan  1mg  given per scheduled taper at 2325. Pt encouraged to try to lay down and rest.  Reported last void about 4 hr ago.  Fluids encouraged.    Pt then to nursing station at 2335 w c/o vomiting.  Small-mod amount of brownish/tan emesis noted in toilet.  Dr. Florence w/ orders received.  UA collected and sent to lab.    Reviewed w/ pt that nausea and vomiting could be a withdrawal symptom from ETOH dependence.  Pt voiced understanding.

## 2011-10-02 NOTE — Nurses Notes (Signed)
Pt at nurses station using telephone when he grabbed at his side and c/o pain. Pt finished call and was advised to go to his room and lay down for a while. Pt did so. Within minutes pt was up and in the day room watching television. When assessed after a few minutes, pt said he still had the pain and left the dayroom. Pt then proceeded to use profanity and state how bad his pain was. Pt requested not to swear, and was informed that he is probably going through withdrawal. Pt said, "I don't care what the f... It is, it feels like a f..ing butcher knife in my side."  Order obtained for simethicone and Ibuprofen.  Pt took Ibuprofen but refused simethicone stating - "Its not f---ing gas!" Pt states he wants the doctor to come and "take a look at my side". This Clinical research associate asks what is the doctor going to look at, is there a mark? Pt got up and walked away.   Pt got up and walked to his room, muttering. When asked what he said pt kept repeating "its not F---ing gas!   Pt asked again to stop using bad language. Pt does not stop.

## 2011-10-02 NOTE — Behavioral Health (Signed)
Pt attended wrap up group.  Rated mood as a 10 and denied SI, HI, and AVH.  Pt says his appetite is "crappy" and he has been unable to eat.  Pt reports vomiting twice today and feeling shaky and lightheaded.  Pt has been observed only drinking coffee.  Pt spent the evening in the dayroom.

## 2011-10-02 NOTE — Progress Notes (Addendum)
[facility] Dual Diagnosis Unit Inpatient Progress Note    Terrance Irwin  Date of Service:  10/02/2011  DOB:  November 28, 1990  MRN:  [id number]    CC:  Admitted for alcohol problem and transferred to adult unit because of cousin on DDU    Diagnoses:  AXIS I: Alcohol use disorder      Adult ADHD      Tobacco use disorder      Cannabis use disorder     AXIS II: Borderline Intellectual Functioning per chart review       Antisocial Personality Traits     AXIS III: Migraine headaches     AXIS IV: Poor coping skills    AXIS V:  GAF 40-45    Subjective:  Slept "good." Skipped breakfast, because it didn't look good and now he is hungry. No medication side effects. He has no questions. He feels cold and he mentions he vomited twice last night. He feels shaky and nauseated this morning.  He would like to be off restrictions. Encouraged good behavior.     Objective:  Terrance Irwin is a casually dressed appearing 21 y.o. male. His speech is soft and slow. He describes his mood as dysphoric and his affect is congruent to mood. His thought process appears linear and with content that contains no paranoia or delusions. In regards to suicidal and homicidal ideations, he states none. His perceptual disturbances include not responding to internal stimuli. His insight and judgment are poor.    Today's Vitals:  BP 120/84   Pulse 98   Temp 36.9 C (98.4 F)   Resp 18   Ht 1.727 m (5\' 8" )   Wt 81.647 kg (180 lb)   BMI 27.37 kg/m2   SpO2 99%  Other Physical Exam:  none    Current Medications:  Current Facility-Administered Medications   Medication Dose Route Frequency   . atomoxetine (STRATTERA) capsule  18 mg/day Oral QAM   . acamprosate (CAMPRAL) enteric coated tablet  666 mg Oral 3x/day   . nicotine (NICOTROL) 10 mg oral inhalation cartridge  10 mg Inhalation Q2H PRN   . nicotine polacrilex (NICORETTE) chewing gum  2 mg Oral Q1H PRN   . nicotine (NICODERM CQ) transdermal patch (mg/24 hr)  42 mg Transdermal Q24H    . lorazepam (ATIVAN) tablet  2 mg Oral Q8HR    Followed by   . lorazepam (ATIVAN) tablet  1 mg Oral Q8HR    Followed by   . lorazepam (ATIVAN) tablet  0.5 mg Oral Q8HR   . nicotine (NICOTROL) 10 mg oral inhalation cartridge  10 mg Inhalation Q2H PRN   . thiamine-vitamin B1 (BETAXIN) tablet  100 mg Oral Daily   . multivitamin tablet  1 Tab Oral Daily   . lorazepam (ATIVAN) tablet  1 mg Oral Q4H PRN   . DISCONTD: nicotine (NICODERM CQ) transdermal patch (mg/24 hr)  21 mg Transdermal Q24H       Assessment:  1. Condition today:  same.  2. No changes to diagnoses.    Plan:  1. No medication changes.  2. Continue psychosocial activities and therapy.    AXIS I:  Alcohol use disorder   Extended Ativan taper (finishes 9/4/203 at 1600)   Multivitamin and Thiamine   Started on Campral 666 mg TID  ADHD   D/C Adderall given patient's history of substance use; outpatient psychiatrist (Dr. Claudia Desanctis) does not plan to continue this medication on an outpatient basis  Started on Strattera 18 mg daily. Titrate as tolerated.  Tobacco use disorder   NRT    AXIS III:  Migraines -- not on any home medications    AXIS IV:  Psychosocial activities, individual therapy and group therapy    Safety/Restrictions:  RTU and SR    Disposition:  To be determined.    Otho Bellows, MD 10/02/2011, 11:38 AM      I saw and examined the patient.  I reviewed the resident's note.  I agree with the findings and plan of care, as documented in the resident's note.  Any exceptions/additions are edited/noted.    Terrance Irwin was pleasant and cooperative this morning.  He was more appreciative of the treatments and he was not negative or using any foul language.  He talked about treatments for ADHD and I informed him that we will most likely increase Strattera towards 25 mg daily, by tomorrow.  He has not experienced any side effects from Strattera 18 mg daily.      Ameet Sandy N. Christena Deem, M.D.   Staff Psychiatrist     Marcille Buffy, MD 10/02/2011, 3:36 PM

## 2011-10-02 NOTE — Behavioral Health (Signed)
Pt was on the phone and begin complaining of severe pain and clutched his right rib.  Pt sat down the phone and walked away.  Pt walked to the end of the hallway and sat down.  Pt rated his pain as a 10 and said it felt like someone was stabbing him.  Pt is currently using profanity and cursing at staff while pacing the hallways.

## 2011-10-03 LAB — URINALYSIS, MACROSCOPIC AND MICROSCOPIC
BILIRUBIN: NEGATIVE
BLOOD: NEGATIVE
GLUCOSE: NEGATIVE mg/dL
KETONES: NEGATIVE mg/dL
LEUKOCYTES: NEGATIVE
NITRITE: NEGATIVE
PH URINE: 5.5 (ref 5.0–8.0)
PROTEIN: NEGATIVE mg/dL
RBC'S: <1 /HPF (ref 0–4)
SPECIFIC GRAVITY, URINE: 1.018 (ref 1.005–1.030)
UROBILINOGEN: NORMAL mg/dL
WBC'S: <1 /HPF (ref 0–1)

## 2011-10-03 MED ORDER — ATOMOXETINE 25 MG CAPSULE
25.00 mg/d | ORAL_CAPSULE | Freq: Every morning | ORAL | Status: DC
Start: 2011-10-03 — End: 2011-10-05
  Administered 2011-10-03 – 2011-10-05 (×3): 25 mg via ORAL
  Filled 2011-10-03 (×3): qty 1

## 2011-10-03 MED ORDER — ATOMOXETINE 25 MG CAPSULE
25.00 mg/d | ORAL_CAPSULE | Freq: Every morning | ORAL | Status: DC
Start: 2011-10-04 — End: 2011-10-03
  Filled 2011-10-03: qty 1

## 2011-10-03 MED ORDER — ONDANSETRON 4 MG DISINTEGRATING TABLET
4.00 mg | ORAL_TABLET | Freq: Three times a day (TID) | ORAL | Status: DC
Start: 2011-10-03 — End: 2011-10-06
  Administered 2011-10-03 – 2011-10-06 (×9): 4 mg via SUBLINGUAL
  Filled 2011-10-03 (×11): qty 1

## 2011-10-03 NOTE — Behavioral Health (Signed)
Process Group-10:00-11:00am- Client attended the session that focused on discussing utilizing the DBT skill for distress tolerance with the acronym: ACCEPTS. Client shared activities he enjoys doing to help him cope with distress such as riding his dirt bike and listening to music.    Estanislado Emms, LGSW 10/03/2011, 3:27 PM\

## 2011-10-03 NOTE — Nurses Notes (Signed)
Improved behavior with off unit with staff privileges. Had a dissappointing phone call. Feels depressed. Mood is 0 of 10. Appetite is poor. Required prn ativan 1 mg po, for a CIWA of 10. Pulse was 128 per pulse ox at this time.

## 2011-10-03 NOTE — Progress Notes (Addendum)
[facility]  Psychiatric Progress Note    Date of Service: 10/03/2011  Patient's condition today:  same  Chief Complaint: alcohol use disorder    Assessment Last 24 hrs:   Per Nursing Report: Patient is c/o left sided back pain, near the area of the left kidney.  Patient with recent non-bloody emesis. Patient is refusing blood draw for labs. Patient requesting to be off restrictions and wants to go outside. Patient is not cooperating with staff.     Subjective: Patient reports he was drinking two 30-packs of beer per day prior to admission. He says his mood is good and he is glad that he quit drinking. He reports poor appetite and says that he is not eating that well. He continues to have non-bloody emesis, but is refusing medications (Zofran). He reports that he slept well. Patient's urinalysis was within normal limits. Will check liver enzymes, lipase and get CBC/BMP.     EXAM  Filed Vitals:    10/02/11 0700 10/02/11 1300 10/02/11 2324 10/03/11 0700   BP: 120/84 118/80 110/76 120/84   Pulse: 98 84 88 80   Temp: 36.9 C (98.4 F)  37.3 C (99.2 F) 36.9 C (98.5 F)   Resp: 18  18 18    Height:       Weight:       SpO2: 99%   96%     Mental Status Exam:  Appearance/Behavior: Patient is alert and grossly oriented. Patient is not cooperative and is agitated.   Eye contact:  Good  Motor: psychomotor agitation present.   Speech:  Soft spoken, slow rate  Mood: good, says he's glad he quit drinking.   Affect: Stable and appropriate.  Thought process: Linear and goal directed.   Thought content: No auditory/visual hallucinations, delusions or paranoia.    Judgement:  Poor.  Insight:  Poor    PE:  -positive for CVA tenderness of left side.     Lab Results reviewed: Lab Results for Last 24 Hours:    Results for orders placed during the hospital encounter of 09/29/11 (from the past 24 hour(s))   URINALYSIS (ROUTINE)       Component Value Range    CHARACTER CLEAR      COLOR YELLOW       SPECIFIC GRAVITY, URINE 1.018  1.005 - 1.030    GLUCOSE NEGATIVE  NEGATIVE mg/dL    BILIRUBIN NEGATIVE  NEGATIVE    KETONES NEGATIVE  NEGATIVE mg/dL    BLOOD NEGATIVE  NEGATIVE    PH URINE 5.5  5.0 - 8.0    PROTEIN NEGATIVE  NEGATIVE mg/dL    UROBILINOGEN NORMAL  0.2 mg/dL    NITRITE NEGATIVE  NEGATIVE    LEUKOCYTES NEGATIVE  NEGATIVE    RBC'S    0 - 4 /HPF    Value: <1  MICROSCOPIC ANALYSIS PERFORMED BY AUTOMATED METHOD    WBC'S <1  0 - 1 /HPF    BACTERIA OCCASIONAL OR LESS  OCL^OCCASIONAL OR LESS    SQUAMOUS EPITHELIAL RARE      MUCOUS LIGHT           Current Facility-Administered Medications:  simethicone (MYLICON) chewable tablet 80 mg Oral Q6H PRN   ibuprofen (MOTRIN) tablet 800 mg Oral Q6H PRN   atomoxetine (STRATTERA) capsule 18 mg/day Oral QAM   acamprosate (CAMPRAL) enteric coated tablet 666 mg Oral 3x/day   nicotine (NICOTROL) 10 mg oral inhalation cartridge 10 mg Inhalation Q2H PRN   nicotine polacrilex (  NICORETTE) chewing gum 2 mg Oral Q1H PRN   nicotine (NICODERM CQ) transdermal patch (mg/24 hr) 42 mg Transdermal Q24H   lorazepam (ATIVAN) tablet 1 mg Oral Q8HR   Followed by      lorazepam (ATIVAN) tablet 0.5 mg Oral Q8HR   nicotine (NICOTROL) 10 mg oral inhalation cartridge 10 mg Inhalation Q2H PRN   thiamine-vitamin B1 (BETAXIN) tablet 100 mg Oral Daily   multivitamin tablet 1 Tab Oral Daily   lorazepam (ATIVAN) tablet 1 mg Oral Q4H PRN       Assessment:  AXIS I: Alcohol use disorder               ADHD               Tobacco use disorder               Cannabis use disorder     AXIS II: Borderline Intellectual Functioning, per chart review                Antisocial Personality Traits     AXIS III: Migraine headaches     AXIS IV: poor coping skills and problems in the social environment    AXIS V:  GAF 45-50    PLAN:    AXIS I:  Alcohol use disorder   Extended Ativan taper (finishes 9/4 at 1600)   Multivitamin and Thiamine  ADHD    Will maintain off Adderall given patient's history of substance use.  It will be important to maintain him off any controlled medications.!!!!              Increase Strattera to 25 mg qAM.   Tobacco use disorder   NRT  AXIS III:  Migraines - not on any home medications  Nausea/Vomiting - Zofran PRN   AXIS IV:  psychosocial activities, individual therapy and group therapy    Safety/Restrictions: RTU  Disposition: TBD    Romeo Rabon, MD 10/03/2011, 2:06 PM      I saw and examined the patient.  I reviewed the resident's note.  I agree with the findings and plan of care, as documented in the resident's note.  Any exceptions/additions are edited/noted.    Emerlyn Mehlhoff N. Christena Deem, M.D.   Staff Psychiatrist     Marcille Buffy, MD 10/03/2011, 2:32 PM

## 2011-10-03 NOTE — ED Provider Notes (Signed)
HPI    History provided by patient     Chief complaint:   Chief Complaint   Patient presents with   . Alcohol Assessment     alcohol intoxication , last drink last night 2000       Terrance Irwin is a 21 y.o. male presenting to the ED with complaint of EtOH intoxication requesting detox.  Pt reporting "shakes" this morning.  Pt last had alcohol at 8pm last night.  His normal consumption of alcohol is 60 beers per night.  Pt also reports Adderall abuse (prescribed this for ADHD but "takes extra when drinking").  Pt also reports last cocaine use 6 mos prior and marijuana use within the past month.  Pt states "I know I need to come clean."   Denies SI/HI.      Review of Systems    Constitutional: No weight loss or gain.  No fatigue, fever, or chills.  No weakness.  Skin: No rashes.  No diaphoresis.  HENT: + headache.  No neck pain or stiffness.  Eyes: No vision changes, blurry or double vision, eye pain or redness.  Ears:  No decreased hearing or tinnitus.  Nose:  No congestion or drainage.  Throat:  No problems with teeth, gums, or tongue.  No non-healing sores.  No sore throat.  Cardiovascular: No chest pain.  No palpitations.  No lower extremity edema.  Respiratory: No shortness of breath. No cough.  No wheezing.  Gastrointestinal:  No abdominal pain.  No nausea. No vomiting.  No stool changes  Genitourinary:  No changes in urinary frequency or urgency.  No rash or discharge.  Musculoskeletal: No back pain.  No muscle or joint pain.  No stiffness, redness, or swelling.  Neurological: No LOC.  No dizziness, fainting, or seizures.  No weakness, numbness, tingling.  + tremor of extremities.   Hematologic:  No easy bruising or bleeding.  Endocrine:  No intolerance to heat or cold.  No excessive thirst.   Psychiatric: No depression.  + substance abuse.  No SI/HI.  All other systems reviewed and are negative      History:   History reviewed with patient except as noted below.   Past Medical History:   Past Medical History   Diagnosis Date   . ADHD (attention deficit hyperactivity disorder)    . Migraine Headaches 05/13/2002   . Headaches 01/07/2000       Past Surgical History: per EMR  No past surgical history on file.    Social History:  History   Substance Use Topics   . Smoking status: Current Everyday Smoker -- 0.5 packs/day   . Smokeless tobacco: Not on file   . Alcohol Use: No     History   Drug Use   . Yes     marijuana   also cocaine    Family History: per EMR  Family History   Problem Relation Age of Onset   . Hypertension     . Cancer           Physical Exam    BP 148/92   Pulse 76   Temp 36.9 C (98.4 F)   Resp 18   Ht 1.727 m (5\' 8" )   Wt 81.647 kg (180 lb)   BMI 27.37 kg/m2   SpO2 99%  Nursing notes reviewed.     Constitutional: NAD. Alert and oriented.  HENT:   Head: Normocephalic and atraumatic.  Mouth/Throat: Oropharynx is clear. Mucous membranes moist. No tremor of  the tongue.   Eyes: EOMI. PERRL. No nystagmus.  Neck: Trachea midline. Neck supple.  Cardiovascular: RRR. No murmurs, rubs or gallops. Intact distal pulses.  Pulmonary/Chest: No respiratory distress. CTAB with equal BS. No wheezes. No crackles. No chest wall tenderness.  Abdominal: Abdomen soft, non distended.  BS+. No tenderness, rebound or guarding.   Musculoskeletal: No tenderness or deformity. No edema.  Skin: Warm and dry. No rash, erythema or pallor.  Neurological: Alert. Grossly intact.  Psychiatric: Normal mood and affect. Behavior is normal.      Course    The patient was seen and evaluated.      Laboratory results are significant for ethanol level normal, UDS negative, UA with protein, and are included below.     Labs Reviewed   URINALYSIS - Abnormal; Notable for the following:     PROTEIN TRACE (*)      All other components within normal limits   DRUG SCREEN, HIGH OPIATE CUTOFF, NO CONFIRMATION, URINE   ETHANOL, SERUM   BASIC METABOLIC PANEL, NON-FASTING   CBC/DIFF   THYROID STIMULATING HORMONE WITH FREE T4 REFLEX    MICROSCOPIC URINALYSIS       Pt was stable while in ED.  Pt was given Ativan for tremors.    Pt seen and evaluated by care management.  Appreciate their rec's.     Clinical Impression and Plan  * EtOH abuse       - will admit for detox      Disposition  Admit for detox      *Melvern Banker, MD 10/03/2011, 9:45 PM

## 2011-10-03 NOTE — Nurses Notes (Signed)
Patients behavior is labile. He goes from being jovial to hitting wall. He was  Concerned this am about side hurting. Doctor ordered bloodwork. His stratera dose is being increased to 25mg  today. He originally agreed to bw and medication. He vomited 2 x and ciwaw was 8. I called to get him zofran and offered him ativan as well. He became angry with  His restrict to unit status. Iexplained we would discuss in the morning again but his behavior needed to be stable and also follow treatment plan as ordered. He hit wall, slammed door and kept stating to call doctor he wanted to go outside for air. Doctor was called. Dr Duffy Rhody states he will stop up to see him this afternoon sometime or second time today. Patient is refusing meds, and bw at this time. I tried to educate to him that bw was important to rule out any problems with pain in side and also that is may be possibility that muscle is pulled from vomiting. He states" I dont care about any of that stuff now, i just want to go outside".

## 2011-10-04 LAB — BASIC METABOLIC PANEL
ANION GAP: 8 mmol/L (ref 5–16)
BUN/CREAT RATIO: 7 (ref 6–22)
BUN: 5 mg/dL — ABNORMAL LOW (ref 8–26)
CALCIUM: 9.6 mg/dL (ref 8.5–10.4)
CARBON DIOXIDE: 27 mmol/L (ref 23–33)
CHLORIDE: 100 mmol/L (ref 96–111)
CREATININE: 0.75 mg/dL (ref 0.62–1.27)
ESTIMATED GLOMERULAR FILTRATION RATE: >59 ml/min/1.73m2 (ref >59)
GLUCOSE,NONFAST: 103 mg/dL (ref 65–139)
POTASSIUM: 3.7 mmol/L (ref 3.5–5.1)
SODIUM: 135 mmol/L — ABNORMAL LOW (ref 136–145)

## 2011-10-04 LAB — CBC/DIFF
BASOPHILS: 0 %
BASOS ABS: 0 10*3/uL (ref 0.0–0.2)
EOS ABS: 0.1 THOU/uL (ref 0.0–0.5)
EOSINOPHIL: 1 %
HCT: 46.6 % (ref 36.7–47.0)
HGB: 16 g/dL (ref 12.5–16.3)
LYMPHOCYTES: 27 %
LYMPHS ABS: 2.9 10*3/uL (ref 1.5–3.5)
MCH: 29.2 pg (ref 27.4–33.0)
MCHC: 34.3 g/dL (ref 32.5–35.8)
MCV: 85 fL (ref 78–100)
MONOCYTES: 8 %
MONOS ABS: 0.8 THOU/uL (ref 0.3–1.0)
MPV: 8.5 fL (ref 7.5–11.5)
PLATELET COUNT: 226 THOU/uL (ref 140–450)
PMN ABS: 6.8 THOU/uL — ABNORMAL HIGH (ref 2.0–6.5)
PMN'S: 64 %
RBC: 5.48 MIL/uL (ref 4.06–5.63)
RDW: 13 % (ref 12.0–15.0)
WBC: 10.6 THOU/uL (ref 3.5–11.0)

## 2011-10-04 LAB — ALT (SGPT): ALT (SGPT): 25 U/L (ref 7–55)

## 2011-10-04 LAB — LIPASE: LIPASE: 29 U/L (ref 6–51)

## 2011-10-04 LAB — AST (SGOT): AST (SGOT): 25 U/L (ref 8–48)

## 2011-10-04 NOTE — Nurses Notes (Signed)
Patient inquiring about staying up on unit to watch [employer] baseball game rather than going to in-house AA meeting. Writer discouraged him from such and explained rules/expectations. He is childlike and needs redirection from nurse's station frequently. He did Pharmacist, hospital draw his blood earlier in shift and is unsure why he continues to have intermittent sharp abdominal pains. Patient reports that he vomited after dinner (although not assessed). Cont scheduled Zofran and Ativan taper. His overall appearance does not match patient's reports of withdrawal and discomfort. VSS with noted cont tachycardia. He agrees to go to [employer].

## 2011-10-04 NOTE — Progress Notes (Addendum)
[facility]  Psychiatric Progress Note    Date of Service: 10/04/2011  Patient's condition today:  same  Chief Complaint: alcohol use disorder    Assessment Last 24 hrs:   Per Nursing Report: Patient reported depressed mood to staff. He rated mood a 0/10. Patient also reported poor appetite. He had a CIWA >10 last night and was given PRN Ativan (HR was 128).     Subjective: Patient reports that he chews his nails due to anxiety. He talked about an increase in his risk taking behavior while drinking. Patient reports education to the 10th grade. Patient lives with his mother. He reports recently breaking up with his girlfriend and also reports that he quit his job 23-month ago. He states he started drinking earlier in the morning and called his boss and quit. He currently does side jobs in Psychologist, clinical for money. Patient reports misuse of his Adderall prescription and combines it with alcohol. He also reports use of marijuana. Patient reported family history of substance abuse saying that his aunt, stepfather, and and his 37 year old brother have problems with alcohol.      Patient reported continued vomiting today and requested an additional Blase Mess saying he vomited after taking the medicine.      Patient was transferred to the DDU today.     EXAM  Filed Vitals:    10/03/11 1900 10/04/11 0700 10/04/11 1200 10/04/11 1500   BP: 110/70 116/64 122/92 110/80   Pulse: 128 91 106 101   Temp: 37.4 C (99.4 F) 37.1 C (98.8 F)  36.4 C (97.6 F)   Resp: 16 18  20    Height:       Weight:       SpO2: 96% 97%  99%     Mental Status Exam:  Appearance/Behavior: Patient is alert and grossly oriented. Patient was more cooperative today but remains agitated.   Eye contact:  Good  Motor: psychomotor agitation present. Presence of tremor.   Speech:  Normal rate/tone/volume.   Mood: improved, patient was removed from RTU early yesterday evening.   Affect: Stable and appropriate.   Thought process: Linear and goal directed.   Thought content: No auditory/visual hallucinations, delusions or paranoia.    Judgement:  Poor.  Insight:  Poor      Lab Results reviewed: Lab Results for Last 24 Hours:    Results for orders placed during the hospital encounter of 09/29/11 (from the past 24 hour(s))   BASIC METABOLIC PANEL, NON-FASTING       Component Value Range    SODIUM 135 (*) 136 - 145 mmol/L    POTASSIUM 3.7  3.5 - 5.1 mmol/L    CHLORIDE 100  96 - 111 mmol/L    CARBON DIOXIDE 27  23 - 33 mmol/L    ANION GAP 8  5 - 16 mmol/L    CREATININE 0.75  0.62 - 1.27 mg/dL    ESTIMATED GLOMERULAR FILTRATION RATE >59  >59 ml/min/1.98m2    GLUCOSE,NONFAST 103  65 - 139 mg/dL    BUN 5 (*) 8 - 26 mg/dL    BUN/CREAT RATIO 7  6 - 22    CALCIUM 9.6  8.5 - 10.4 mg/dL   CBC/DIFF       Component Value Range    WBC 10.6  3.5 - 11.0 THOU/uL    RBC 5.48  4.06 - 5.63 MIL/uL    HGB 16.0  12.5 - 16.3 g/dL    HCT 46.6  36.7 - 47.0 %    MCV 85.0  78 - 100 fL    MCH 29.2  27.4 - 33.0 pg    MCHC 34.3  32.5 - 35.8 g/dL    RDW 60.4  54.0 - 98.1 %    PLATELET COUNT 226  140 - 450 THOU/uL    MPV 8.5  7.5 - 11.5 fL    PMN'S 64      PMN ABS 6.800 (*) 2.0 - 6.5 THOU/uL    LYMPHOCYTES 27      LYMPHS ABS 2.900  1.5 - 3.5 THOU/uL    MONOCYTES 8      MONOS ABS 0.800  0.3 - 1.0 THOU/uL    EOSINOPHIL 1      EOS ABS 0.100  0.0 - 0.5 THOU/uL    BASOPHILS 0      BASOS ABS 0.000  0.0 - 0.2 THOU/uL   LIPASE       Component Value Range    LIPASE 29  6 - 51 U/L   AST (SGOT)       Component Value Range    AST (SGOT) 25  8 - 48 U/L   ALT (SGPT)       Component Value Range    ALT (SGPT) 25  7 - 55 U/L         Current Facility-Administered Medications:  ondansetron (ZOFRAN ODT) rapid dissolve tablet 4 mg Sublingual Q8HRS   atomoxetine (STRATTERA) capsule 25 mg/day Oral QAM   simethicone (MYLICON) chewable tablet 80 mg Oral Q6H PRN   ibuprofen (MOTRIN) tablet 800 mg Oral Q6H PRN   acamprosate (CAMPRAL) enteric coated tablet 666 mg Oral 3x/day    nicotine (NICOTROL) 10 mg oral inhalation cartridge 10 mg Inhalation Q2H PRN   nicotine polacrilex (NICORETTE) chewing gum 2 mg Oral Q1H PRN   nicotine (NICODERM CQ) transdermal patch (mg/24 hr) 42 mg Transdermal Q24H   lorazepam (ATIVAN) tablet 0.5 mg Oral Q8HR   nicotine (NICOTROL) 10 mg oral inhalation cartridge 10 mg Inhalation Q2H PRN   thiamine-vitamin B1 (BETAXIN) tablet 100 mg Oral Daily   multivitamin tablet 1 Tab Oral Daily   lorazepam (ATIVAN) tablet 1 mg Oral Q4H PRN       Assessment:  AXIS I: Alcohol use disorder               ADHD               Tobacco use disorder               Cannabis use disorder   AXIS II: Borderline Intellectual Functioning, per chart review                Antisocial Personality Traits   AXIS III: Migraine headaches   AXIS IV: poor coping skills and problems in the social environment  AXIS V:  GAF 45-50    PLAN:    AXIS I:  Alcohol use disorder   Extended Ativan taper (finishes 9/4 at 1600)   Multivitamin and Thiamine  ADHD   Will maintain off Adderall given patient's history of substance use.  It will be important to maintain him off any controlled  medications.!!!!               Strattera 25 mg qAM.   Tobacco use disorder   NRT  AXIS III:  Migraines - not on any home medications  Nausea/Vomiting - Zofran PRN   C/o stomach pain - patient's labs negative (lipase,  AST/ALT, Cr all within normal limits). Patient reported his left side felt better when I talked to him yesterday.   AXIS IV:  psychosocial activities, individual therapy and group therapy    Safety/Restrictions: SRs  Disposition: Transfer to DDU    Romeo Rabon, MD 10/04/2011, 5:17 PM      I saw and evaluated the patient on the date of service with the resident. I agree with the findings and plan of care as documented in the resident's note.  Any exceptions/additions are edited/noted.     Yolanda Bonine, MD 10/05/2011, 5:23 PM

## 2011-10-04 NOTE — Behavioral Health (Signed)
Process Group: Group discussed mindfulness and self-soothe skills. Client was asked to share examples of self soothe techniques using all 5 senses. Client described several things that are soothing for him such as riding his four-wheeler and enjoying the scenery on the trails. He also talked about enjoying pictures, books, the beach, food and exhaust fumes. Client was restless and hyper verbal.

## 2011-10-04 NOTE — Ancillary Notes (Signed)
TREATMENT TEAM MEETING  MD:  Lendell Caprice  Resident:  Duffy Rhody  Nurse:  Hollice Espy  Therapists:  Miguel Dibble  Discharge Planner:  Luan Pulling    Progress Note:  Patient continues with antisocial behaviors toward peers.  States he is still having withdrawal, states he vomited this morning.    Goals Completion/Modification:  Patient is meeting treatment objectives of alcohol detox    Plan:  Transfer patient back to DDU when his cousin is discharged  Discharge Criteria:  When Absent of SI/HI, therapeutic medication regime achieved, positive coping skills identified and safety planning established.  Anticipated Discharge Date: TBD  Follow Up Care:  Needs to be established.

## 2011-10-05 MED ORDER — DISULFIRAM 250 MG TABLET
500.0000 mg | ORAL_TABLET | Freq: Every day | ORAL | Status: DC
Start: 2011-10-05 — End: 2011-10-06
  Administered 2011-10-05 – 2011-10-06 (×2): 500 mg via ORAL
  Filled 2011-10-05 (×2): qty 2

## 2011-10-05 MED ORDER — ATOMOXETINE 40 MG CAPSULE
80.00 mg/d | ORAL_CAPSULE | Freq: Every morning | ORAL | Status: DC
Start: 2011-10-06 — End: 2011-10-06
  Administered 2011-10-06: 80 mg via ORAL
  Filled 2011-10-05: qty 2

## 2011-10-05 NOTE — Behavioral Health (Signed)
PC to patients Mother.  Mother states she is very concerned for her son, not just because of his drinking, but also because of his ADHD and history of conduct disorder.  She states that even though he is 21 years old she believes he acts and thinks more like a 21 year old and she is not aware of how to deal with this.  States she is very concerned for his welfare espically if he continues to drink the way he has been.  She is happy that he is willing to go on to 28 day treatment and believes this will be best for him.  She is willing to participate in his care in anyway possible.  Will follow.

## 2011-10-05 NOTE — Behavioral Health (Addendum)
Attended AA 

## 2011-10-05 NOTE — Discharge Instructions (Addendum)
[facility]  [address]  [place] , [place] [address]  [phone]  Fax-[phone]    Local AA/Na list provided for patients area    DISCHARGE INSTRUCTIONS     Patient discharged to: Home / Self Care via [employer] and accompanied by: family / significant other.    Diet: none necessary.  Activity: as tolerated.  Follow up care:  appointment card given to patient / family / significant other.  Patient belongings given to: Patient    Nursing Patient Assessment at discharge: Patient denies any suicidial, homicidal ideations.  , Patient denies having auditiory, visual hallucinations., Patient states they understand discharge instructions., Patient states that they understand importance of medication compliance. and Patient is stable for discharge.

## 2011-10-05 NOTE — Behavioral Health (Signed)
Met with patient.  Patient states he came in to get help because he has lost everything and realized that his life was going nowhere.  States he has lost several friends due to drugs, noted that last Mar 02, 2022 his best friend died due to drugs and he has been drinking ever since and been getting nowhere.  States he dripped out of school in the 10 th grade and did not get his GED, hopes to do this at some point but is unable to do anything at this point due to his drinking.  Patient is interested in a 28 day program, and possible long term treatment thereafter.  Will inform DCP.  Patient was appropriate during session, was very anxious, distractible, but polite and seemed interested in getting help.

## 2011-10-05 NOTE — Nurses Notes (Signed)
Patient hesitant to go to in-house NA meeting, however when learning that he would be place back on RTU he agreed to go. He is childlike, manipulative, hyper, and intrusive this evening. Ativan taper completed, he is aware of probable discharge tomorrow. Had been reporting headache, nausea, and cont abdominal cramping earlier in shift. Patient's mother brought in [place] food for patient, no vomiting reported/observed. His behavior is incongruent with his reports of withdrawal and pains. Redirection needed for behavior and from nurse's station. VSS, will monitor.

## 2011-10-05 NOTE — Progress Notes (Addendum)
 [place]  [facility]  Psychiatric Progress Note    Date of Service: 10/05/2011  Patient's condition today:  same  Chief Complaint: alcohol  use disorder    Assessment Last 24 hrs:   Per Nursing Report:  Transferred from Adult Unit yesterday afternoon. Seems ambivalent to care.    Subjective: Patient states older members of group telling their stories makes me wanna quit.  Tells of family stressors, including living with father and alcoholic brother.  Talks about risky behavior that he does, and is clearly proud of it.  For laughs and giggles.  Admits he has quit job due to drinking.  Wants to consider other options.  Complaining of vomiting and abdominal pain and of decreased appetite.    EXAM  Filed Vitals:    10/04/11 2300 10/05/11 0740 10/05/11 1100 10/05/11 1500   BP: 118/78 100/80 110/80 118/72   Pulse: 94 90 86 87   Temp: 37.1 C (98.8 F) 36 C (96.8 F) 36.7 C (98.1 F) 36.3 C (97.4 F)   Resp: 18 18 16 16    Height:       Weight:       SpO2: 98% 99% 98% 98%     Mental Status Exam:  Orientation: A and O X 3  Appearance:  Well groomed  Eye Contact:  Fair  Behavior: Cooperative but cannot sit still; drinks a lot of coffee (decaf) with a lot of sugar; actually makes vocalizations on occasion  Attention:  Fair  Speech:  Regular rate and elevated volume  Motor:  Extremely fidgety, gets up at least 6 times during group rounds  Affect:  Happy  Thought Process:  Linear and goal directed  Thought Content:  No delusions, preoccupations or phobias voiced; talks about risky behavior in proud manner  Suicidal Ideation: None voiced  Perception:  No auditory or visual hallucinations   Insight:  Poor  Judgment:  Poor          Lab Results reviewed: Lab Results for Last 24 Hours:    No results found for any visits on 09/29/11 (from the past 24 hour(s)).      Current Facility-Administered Medications:  atomoxetine  (STRATTERA ) capsule 80 mg/day Oral QAM   disulfiram  (ANTABUSE ) tablet 500 mg Oral Daily      ondansetron  (ZOFRAN  ODT) rapid dissolve tablet 4 mg Sublingual Q8HRS   simethicone  (MYLICON) chewable tablet 80 mg Oral Q6H PRN   ibuprofen  (MOTRIN ) tablet 800 mg Oral Q6H PRN   acamprosate  (CAMPRAL ) enteric coated tablet 666 mg Oral 3x/day   nicotine  (NICOTROL ) 10 mg oral inhalation cartridge 10 mg Inhalation Q2H PRN   nicotine  polacrilex (NICORETTE) chewing gum 2 mg Oral Q1H PRN   nicotine  (NICODERM CQ ) transdermal patch (mg/24 hr) 42 mg Transdermal Q24H   lorazepam  (ATIVAN ) tablet 0.5 mg Oral Q8HR   nicotine  (NICOTROL ) 10 mg oral inhalation cartridge 10 mg Inhalation Q2H PRN   thiamine -vitamin B1 (BETAXIN) tablet 100 mg Oral Daily   multivitamin tablet 1 Tab Oral Daily   lorazepam  (ATIVAN ) tablet 1 mg Oral Q4H PRN       Assessment:  AXIS I: Alcohol  use disorder; ADHD; Tobacco use disorder; Cannabis use disorder   AXIS II: Borderline Intellectual Functioning, per chart review; Antisocial Personality Traits   AXIS III: Migraines  AXIS IV: poor coping skills and problems in the social environment    PLAN:    AXIS I:  Alcohol  use disorder  -Extended Ativan  taper (finishes 9/4 at 1600)  -Multivitamin and Thiamine   -Antabuse   started, 500 mg daily    ADHD  -Strattera  increased to 80 mg qAM.     Tobacco use disorder  -NRT    AXIS III:  Migraines   -Not on any home medications    Nausea/Vomiting   -Zofran  PRN     Abdominal pain   -Labwork WNL      -Change to bland diet    AXIS IV:  -Psychosocial activities, individual therapy and group therapy    Safety/Restrictions: SRs  Disposition: To be determined.    [name], MD 10/05/2011, 5:37 PM  I saw and evaluated the patient on the date of service with the resident. I agree with the findings and plan of care as documented in the resident's note.  Any exceptions/additions are edited/noted.     [name], MD 10/06/2011, 4:54 PM

## 2011-10-06 MED ORDER — ACAMPROSATE 333 MG TABLET,DELAYED RELEASE
666.00 mg | DELAYED_RELEASE_TABLET | Freq: Three times a day (TID) | ORAL | Status: DC
Start: 2011-10-06 — End: 2011-10-06

## 2011-10-06 MED ORDER — DISULFIRAM 250 MG TABLET
ORAL_TABLET | ORAL | Status: DC
Start: 2011-10-06 — End: 2017-06-15

## 2011-10-06 MED ORDER — ATOMOXETINE 80 MG CAPSULE
80.00 mg | ORAL_CAPSULE | Freq: Every morning | ORAL | Status: DC
Start: 2011-10-06 — End: 2017-06-15

## 2011-10-06 MED ORDER — ACAMPROSATE 333 MG TABLET,DELAYED RELEASE
666.00 mg | DELAYED_RELEASE_TABLET | Freq: Three times a day (TID) | ORAL | Status: DC
Start: 2011-10-06 — End: 2017-06-15

## 2011-10-06 NOTE — Progress Notes (Addendum)
 [place]  [facility]  Psychiatric Progress Note    Date of Service: 10/06/2011  Patient's condition today:  same  Chief Complaint: alcohol  use disorder    Assessment Last 24 hrs:   Per Nursing Report:  Has difficulty focusing.  Worried about discharge. Seems genuine in his motivation.  Wants a 28-day program. Tends to be disruptive and childlike, attention seeking, and often demonstrates splitting of staff.    Subjective: Patient states he feels calmer with increase of Strattera . Asks about a 28-day program, feels it would help to keep me outta trouble.  Really feels keeping away from family would be best.  Talks about loving to see his brother drink while on Antabuse  if he tries to get him to drink again.  Talks about his family not caring about him, as opposed to group of patients and staff, who he feels care greatly, like family.  Later seen cursing about therapist.    Frequently c/o abdominal pain but ate fine when mom brought him [place].     EXAM  Filed Vitals:    10/05/11 1100 10/05/11 1500 10/05/11 2300 10/06/11 0730   BP: 110/80 118/72 102/80 110/70   Pulse: 86 87 77 85   Temp: 36.7 C (98.1 F) 36.3 C (97.4 F) 36.4 C (97.6 F) 36.8 C (98.2 F)   Resp: 16 16 20 18    Height:       Weight:       SpO2: 98% 98% 98% 98%     Mental Status Exam:  Orientation: A and O X 3  Appearance:  Well groomed, wearing t-shirt, shorts, baseball cap.    Eye Contact:  Fair  Behavior: Cooperative but still up and down a lot during group; tends to be disruptive, blurting things out when others are taking  Attention:  Fair  Speech:  Regular rate and elevated volume  Motor:  Fidgety  Affect:  Happy  Thought Process:  Linear and goal directed  Thought Content:  No delusions, preoccupations or phobias voiced; talks about family being a trigger.  Suicidal Ideation: Denies  Perception:  No auditory or visual hallucinations   Insight:  Poor  Judgment:  Poor          Lab Results reviewed: Lab Results for Last 24 Hours:      No results found for any visits on 09/29/11 (from the past 24 hour(s)).      Current Facility-Administered Medications:  atomoxetine  (STRATTERA ) capsule 80 mg/day Oral QAM   disulfiram  (ANTABUSE ) tablet 500 mg Oral Daily   ondansetron  (ZOFRAN  ODT) rapid dissolve tablet 4 mg Sublingual Q8HRS   simethicone  (MYLICON) chewable tablet 80 mg Oral Q6H PRN   ibuprofen  (MOTRIN ) tablet 800 mg Oral Q6H PRN   acamprosate  (CAMPRAL ) enteric coated tablet 666 mg Oral 3x/day   nicotine  (NICOTROL ) 10 mg oral inhalation cartridge 10 mg Inhalation Q2H PRN   nicotine  polacrilex (NICORETTE) chewing gum 2 mg Oral Q1H PRN   nicotine  (NICODERM CQ ) transdermal patch (mg/24 hr) 42 mg Transdermal Q24H   nicotine  (NICOTROL ) 10 mg oral inhalation cartridge 10 mg Inhalation Q2H PRN   thiamine -vitamin B1 (BETAXIN) tablet 100 mg Oral Daily   multivitamin tablet 1 Tab Oral Daily   lorazepam  (ATIVAN ) tablet 1 mg Oral Q4H PRN       Assessment:  AXIS I: Alcohol  use disorder; ADHD; Tobacco use disorder; Cannabis use disorder   AXIS II: Borderline Intellectual Functioning, per chart review; Antisocial Personality Traits   AXIS III: Migraines  AXIS IV:  poor coping skills and problems in the social environment  AXIS V: GAF: 55    PLAN:    AXIS I:  Alcohol  use disorder  -Extended Ativan  taper completed, VSS  -Multivitamin and Thiamine   -Antabuse  500 mg daily, will continue to take this dose x 1 week post d/c, then decrease to 250 mg daily    ADHD  -Strattera  80 mg qAM.     Tobacco use disorder  -NRT    AXIS III:  Migraines   -Not on any home medications    Nausea/Vomiting   -Zofran  PRN     Abdominal pain   -Labwork WNL      -Bland diet    AXIS IV:  -Psychosocial activities, individual therapy and group therapy    Safety/Restrictions: SR  Disposition: Discharge today.    [name], MD 10/06/2011, 8:31 AM  I saw and evaluated the patient on the date of service with the resident. I agree with the findings and plan of care as documented in the resident's  note.  Any exceptions/additions are edited/noted.     [name], MD 10/06/2011, 4:53 PM

## 2011-10-06 NOTE — Behavioral Health (Signed)
 Referred patient to long term programs. [facility] will not have beds for several weeks. [facility] has been sent the referral and I am waiting for a response.

## 2011-10-06 NOTE — Behavioral Health (Signed)
Met with patient to review DC plan.  Referral has been sent to [facility] [place], patient had phone interview, patient has been accepted for admission tomorrow.  Patient contacted his mother by phone and informed her of discharge today and need for transport tomorrow.  Mother arranging transport.  Patient scheduled for DC today to be admitted to [facility] tomorrow.

## 2011-10-06 NOTE — Discharge Summary (Addendum)
 DISCHARGE SUMMARY      PATIENT NAME:  Terrance Irwin, Terrance Irwin  MRN:  [id number]  DOB:  05/21/1990    ADMISSION DATE:  09/29/2011  DISCHARGE DATE:  10/06/2011    ATTENDING PHYSICIAN: Dr. Floretta  PRIMARY CARE PHYSICIAN: No Established Pcp     CHIEF COMPLAINT:  Alcohol  Use Disorder    DISCHARGE DIAGNOSIS:     Principle Problem:   AXIS I: Alcohol  use disorder; ADHD; Tobacco use disorder; Cannabis use disorder   AXIS II: Borderline Intellectual Functioning, per chart review; Antisocial Personality Traits   AXIS III: Migraines   AXIS IV: poor coping skills and problems in the social environment   AXIS V: GAF: 55      Active Non-Hospital Problems    Diagnosis Date Noted   . ADHD (Attention Deficit Hyperactivity Disorder) 11/05/2007   . Borderline Intellectual Functioning 11/05/2007   . Nicotine  Dependence 11/05/2007   . Cannabis Abuse 11/05/2007   . Migraine Headaches 05/13/2002      DISCHARGE MEDICATIONS:  Current Discharge Medication List      START taking these medications    Details   atomoxetine  (STRATTERA ) 80 mg Oral Capsule Take 1 Cap (80 mg total) by mouth Every morning  Qty: 30 Cap, Refills: 0      disulfiram  (ANTABUSE ) 250 mg Oral Tablet Take 2 tablets once a day x 1 week.  Then take 1 tablet daily.  Qty: 30 Tab, Refills: 5      acamprosate  (CAMPRAL ) 333 mg Oral Tablet, Delayed Release (E.C.) Take 2 Tabs (666 mg total) by mouth Three times a day  Qty: 180 Tab, Refills: 5         STOP taking these medications       dextroamphetamine -amphetamine  (ADDERALL XR) 20 mg Oral Capsule, Sust. Release 24 hr Comments:   Reason for Stopping:         cephALEXin  (KEFLEX ) 500 mg Oral Capsule Comments:   Reason for Stopping:         amphetamine -dextroamphetamine  (ADDERALL XR) 20 mg Cp24 Comments:   Reason for Stopping:             DISCHARGE INSTRUCTIONS:   [facility]   [address]   [place] , [place] [address]   [phone]   Fax-[phone]   Local AA/Na list provided for patients area     Your To Do List       Future  Appointments:  Provider:  Department:  Dept Phone:  Center:     10/11/2011 11:30 AM  Terrance Fair, MD  Behavioral Medicine  [phone]  [facility]          Future Orders  Please Complete By  Expires     DISCHARGE INSTRUCTION - MISC   04/04/12     Comments:     Please attend all follow-up appointments and take your medications daily. Abstain from using alcohol . Attend AA meetings as much as possible, at least 90 meetings in 90 days.     Additional Info:        DISCHARGE INSTRUCTION - MISC   Please attend all follow-up appointments and take your medications daily.  Abstain from using alcohol .  Attend AA meetings as much as possible, at least 90 meetings in 90 days.            REASON FOR HOSPITALIZATION AND HOSPITAL COURSE:  This is a 21 y.o., male admitted to [facility] ED on 8/29 seeking help with alcohol  abuse.  Had been drinking 1-2 30 packs  of beer daily since November.  Also discussed risky behavior he engages in while drunk, although denies SI.  Made comment about family wanting him to quit drinking.  After medical clearance he was transferred to [facility] DDU later that same day.    Patient was started on an Ativan  taper.  Due to concerns of abuse, his Adderall was held, later confirmed by his mother.  Later he was started on Strattera  and dose was titrated up to 80 mg daily at time of discharge.  He attended all groups and always participated, often excessively so, interrupting others and being disruptive.  At one point, he had to be transferred to the Adult Unit due to altercation with another patient on the unit.  He was transferred back to DDU when this patient was discharged.  During his stay he was agreeable to starting Campral  to help with cravings.  AST/ALT were WNL and he expressed interest in starting Antabuse  therefore this was started at 500 mg.      Patient remained hyperactive throughout his stay but reported improvement with Strattera .  He was often disruptive to the milieu, but this is thought to be due mainly  to his ADHD as opposed to frank ill will. He did, however, frequently demonstrate splitting of staff, and even his own family.  At admission he reports family making him get help, but at discharge claimed they did not care about him like we do.  He was often child-like in his behavior and insight is poor.  He is also impulsive, which will limit his chances of recovery.  Also a limiting factor is his current living situation, with his brother likely to be a trigger for his drinking.  Positive influences seem to be his mother, and that he has a genuine interest in recovery.    At the time of discharge patient denies suicidal ideation and agrees to follow-up as listed above.     CONDITION ON DISCHARGE:  A. Ambulation: Full ambulation  B. Self-care Ability: Complete  C. Cognitive Status Alert and Oriented x 3    DISCHARGE DISPOSITION:  Home discharge     cc: Primary Care Physician:  No Established Pcp  No address on file     rr:[id number] Physician:  Terrance Irwin, MSW  No address on file   Referring providers can utilize [email or web address] to access their referred [employer] patient's information.    Terrance DELENA Gentry, MD      I saw and evaluated the patient on the date of service with the resident. I agree with the findings and plan of care as documented in the resident's note.  Any exceptions/additions are edited/noted.     Terrance Archer, MD 10/06/2011, 4:53 PM

## 2011-10-07 NOTE — Behavioral Health (Signed)
Faxed discharge summary to [facility] on 10/07/11

## 2011-10-11 ENCOUNTER — Encounter (HOSPITAL_COMMUNITY): Payer: 59 | Admitting: Psychiatry

## 2011-10-12 ENCOUNTER — Other Ambulatory Visit (HOSPITAL_COMMUNITY): Payer: Self-pay

## 2011-10-12 NOTE — Behavioral Health (Signed)
Received phone call from  Rep. From [employer]). Insurance needed some clinical information in order to process request for higher tier payment for Alcohol rehab at [facility]. Information provided.

## 2011-10-12 NOTE — Progress Notes (Signed)
Attempted to contact patient for case management services on home number listed. Left voice mail message to return my call at [phone].   Kyiah Canepa, RN  Outpatient Case Manager  [facility]  [email or web address]  [phone]

## 2011-10-14 ENCOUNTER — Other Ambulatory Visit (HOSPITAL_COMMUNITY): Payer: Self-pay

## 2011-10-14 NOTE — Progress Notes (Signed)
Attempted to contact patient for case management services on mobile number listed. Left voice mail message to return my call at [phone].   Clayten Allcock, RN  Outpatient Case Manager  [facility]  [email or web address]  [phone]

## 2011-10-28 ENCOUNTER — Other Ambulatory Visit (HOSPITAL_COMMUNITY): Payer: Self-pay

## 2011-10-28 NOTE — Progress Notes (Signed)
Attempted to contact patient for case management services on home number listed. Left voice mail message to return my call at [phone].   Jocelin Schuelke, RN  Outpatient Case Manager  [facility]  [email or web address]  [phone]

## 2011-11-04 ENCOUNTER — Other Ambulatory Visit (HOSPITAL_COMMUNITY): Payer: Self-pay

## 2011-11-04 NOTE — Progress Notes (Signed)
Transitions of Care Note     Reason for Referral: New Admission.    Phone visit for follow up: Admitted to: [facility] for detox 09/29/11-10/06/11. Patient transferred to program in [place] for 28 day rehab stay. He is discharged to home with self care. He was given instructions to attend AA meetings and intense outpatient.     Source/Contact: Patient  Consent: Verbal consent for case management: Yes    SUBJECTIVE    Current concerns/problems: Patient reports: arrived home today. Patient states he feels much better not drinking or using drugs- says he has more energy and feels like he can accomplish goals. He verbalizes concerns of family being trigger for use- states his father is alcoholic and brother is drug abuser. He was told smoking is a trigger for him also. Patient wants to start at [employer] to obtain [employer] and training. He was given the name of a [place] house in [place] but says he can't stay there 90 days and go to job [employer].      REVIEW OF SYSTEMS    Patient/Other Reports:    CV: Denies problems  Pulmonary: Denies problems  Chills/Sweats/Fever: Denies fever  Appetite: Experiencing decreased appetite  Bowel/Bladder: Denies problems  Wound (If applicable): N/A  Pain: Denies  Sleep: Denies problems and sleeps 6-7 hours per night on average  Exercise: Currently not exercising  Self Monitoring: N/A  Psychosocial Issues: Alcohol consumption: has been free for about 5 weeks, Tobacco abuse: Smokeless: 1 can every 2 days and Cigarettes (PPD): 1 pack per day and Drug abuse: has been free for about 5 weeks    FUNCTIONAL STATUS:      ADL'S - Needs Assistance With: N/A as patient is independent   IADL'S - Needs Assistance With: N/A as patient is independent   Cognitive and Mental Health: able to communicate, understand instructions, process information    OBJECTIVE    MEDICATION RECONCILIATION     Medications: Discharge med list reviewed with patient or caregiver and Does not take all medications as prescribed -  Side effects: nightmares from Seroquel    COPD: N/A    CASE MANAGEMENT ASSESSMENT    Case Management Risk Stratification: High Risk: Post Hospital Discharge    RISK ASSESSMENT    Fall Risk Factor Assessment: No risk factors. Denies any falls within last year.  Medication Risk Assessment: history of alcohol and drug abuse  Case Management Readmission Risk Tool: High Risk: Readmission risk in last 14 days and triggers for relapse present    PLAN    Goals  Avoid triggers for alcohol and drugs  Decrease amount of tobacco slowly    CASE MANAGEMENT PLAN OF CARE -  Explained role of case Production designer, theatre/television/film.  Encouraged  to call with any issues or concerns. Gave direct phone number and contact information.  Encouraged to attend AA meeting over weekend and try to find a sponsor, avoid people who trigger use, utilize music or walking to avoid temptation    SELF MANAGEMENT ACTION PLAN: To be completed at next follow-up.    CASE MANAGEMENT INTERVENTIONS  Life Planning Interventions: Discussed Advance Directive/Power of Attorney with patient/family and Member/caregiver declined referral  Transition of Care Interventions: Medication reconciliation and Develop/confirm action plan for acute exacerbation.     Follow-up as indicated above.  Bard Herbert, RN  Outpatient Case Manager

## 2011-11-11 ENCOUNTER — Other Ambulatory Visit (HOSPITAL_COMMUNITY): Payer: Self-pay

## 2011-11-11 NOTE — Progress Notes (Signed)
Case Management Return Patient Note      SUBJECTIVE    Current concerns/problems: Patient Reports: he remains alcohol and drug free; it is difficult for him due to being at home; he has not found a sponsor but has been talking to someone at AA meetings & hopes to be able to have that person sponsor him; plans to ask physician to fill out forms for [employer] on Thursday    REVIEW OF SYSTEMS    Patient/Other Reports:    CV: Denies problems  Pulmonary: Denies problems  Chills/Sweats/Fever: Denies chills/sweats and Denies fever  Appetite: Experiencing decreased appetite  Bowel/Bladder: Denies problems  Wound (If applicable): N/A  Pain: Denies  Sleep: sleeps 6-7 hours per night on average  Exercise: Currently not exercising  Self Monitoring: N/A  Psychosocial Issues: Alcohol consumption: has been alcohol free for about 6 weeks, Tobacco abuse: Smokeless: uses 1 can every 2 days and Cigarettes (PPD): 1 pack per day  and Drug abuse: has been drug free about 6 weeks    OBJECTIVE    MEDICATION RECONCILIATION    Medications: Does not take all medications as prescribed -  Side effects: nightmares from Seroquel    ASPIRIN USE EFFECTIVE CARE MEASURE:  Member is: Excluded from this measure due to age     Recent Vital Signs:   BP Readings from Last 3 Encounters:   10/06/11 115/75   09/29/11 148/92   08/18/11 120/60     Weight:   Wt Readings from Last 3 Encounters:   09/29/11 81.647 kg (180 lb)   09/29/11 81.647 kg (180 lb)   08/18/11 82.2 kg (181 lb 3.5 oz)       CASE MANAGEMENT ASSESSMENT    Case Management Risk Stratification: High Intensity: Current/recent admission    PLAN    PRIORITIZED GOALS:  -  Short Term:  Prevent relapse of drug and alcohol use  Prevent readmission    Long Term:   Development of self - management action plan with patient/caregiver/ provider.  , Patient and caregiver demonstrate basic understanding of their disease process.  , Exacerbations have been prevented, minimized or reduced in severity. , Co-morbid conditions identified and managed. and Patient/ caregiver demonstrates adherence to treatment plan.      IDENTIFIED BARRIERS:     Poor/inadequate support and per patient family members abuse alcohol & drugs so do not help patient avoid triggers    CASE MANAGEMENT PLAN OF CARE:   Eeinforced role of case Production designer, theatre/television/film.  Encouraged  to call with any issues or concerns. Gave direct phone number and contact information.  Avoid triggers for drugs and alcohol, attend AA meetings as required; find a sponsor at [employer], attend intense outpatient as scheduled; utilize music or exercise to avoid temptation/triggers   Re-education on plan of care    SELF MANAGEMENT ACTION PLAN: Written plan developed and mailed to member.    CASE MANAGEMENT INTERVENTIONS  Quality Measures: Influenza Vaccine: To be addressed at future visit.  Transition of Care Interventions: Develop/confirm action plan for acute exacerbation.   Medication Interventions: Discussed adherence to medication regimen  Symptom Monitoring Interventions: Not indicated at this time   Coordination of Services/Social Support: No services needed at this time  Behavioral Health Needs Interventions: has schedule for appointments  Safety Management Interventions: Information mailed to member  Life Planning Interventions: Member/caregiver declined referral    Re-evaluation of plan of care and progress towards goals achievement:  Plan to: call patient in 1-2 weeks to reassess  and update plan of care, instructed to call Case Manager or Primary Care Provider with change in symptoms or as needed before next follow-up, verbalizes understanding and agrees with plan    Bard Herbert, RN  Outpatient Case Manager

## 2011-11-17 ENCOUNTER — Ambulatory Visit (HOSPITAL_COMMUNITY): Payer: 59

## 2011-11-17 ENCOUNTER — Ambulatory Visit (INDEPENDENT_AMBULATORY_CARE_PROVIDER_SITE_OTHER): Payer: 59 | Admitting: Clinical Social Worker

## 2011-11-17 ENCOUNTER — Encounter (FREE_STANDING_LABORATORY_FACILITY)
Admit: 2011-11-17 | Discharge: 2011-11-17 | Disposition: A | Discharge status: Home / Self Care | Payer: 59 | Attending: Psychiatry | Admitting: Psychiatry

## 2011-11-17 VITALS — BP 132/80 | HR 82 | Wt 186.1 lb

## 2011-11-17 DIAGNOSIS — F1120 Opioid dependence, uncomplicated: Secondary | ICD-10-CM | POA: Diagnosis present

## 2011-11-17 LAB — URINALYSIS, MACROSCOPIC AND MICROSCOPIC
BILIRUBIN: NEGATIVE
BLOOD: NEGATIVE
GLUCOSE: NEGATIVE mg/dL
KETONES: NEGATIVE mg/dL
LEUKOCYTES: NEGATIVE
NITRITE: NEGATIVE
PH URINE: 7.5 (ref 5.0–8.0)
RBC'S: 1 /HPF (ref 0–4)
SPECIFIC GRAVITY, URINE: 1.022 (ref 1.005–1.030)
UROBILINOGEN: NORMAL mg/dL
WBC'S: 1 /HPF (ref 0–1)

## 2011-11-17 LAB — CBC/DIFF
BASOPHILS: 0 %
BASOS ABS: 0 THOU/uL (ref 0.0–0.2)
EOS ABS: 0.2 THOU/uL (ref 0.0–0.5)
EOSINOPHIL: 2 %
HCT: 47.1 % — ABNORMAL HIGH (ref 36.7–47.0)
HGB: 15.9 g/dL (ref 12.5–16.3)
LYMPHOCYTES: 28 %
LYMPHS ABS: 3 THOU/uL (ref 1.5–3.5)
MCH: 28.8 pg (ref 27.4–33.0)
MCHC: 33.9 g/dL (ref 32.5–35.8)
MCV: 85.1 fL (ref 78–100)
MONOCYTES: 8 %
MONOS ABS: 0.8 THOU/uL (ref 0.3–1.0)
MPV: 9.5 fL (ref 7.5–11.5)
PLATELET COUNT: 230 THOU/uL (ref 140–450)
PMN ABS: 6.6 10*3/uL — ABNORMAL HIGH (ref 2.0–6.5)
PMN'S: 62 %
RBC: 5.54 MIL/uL (ref 4.06–5.63)
RDW: 12.9 % (ref 12.0–15.0)
WBC: 10.5 10*3/uL (ref 3.5–11.0)

## 2011-11-17 LAB — HEPATIC FUNCTION PANEL
ALBUMIN: 4.2 g/dL (ref 3.5–4.8)
ALKALINE PHOSPHATASE: 55 U/L (ref 38–126)
ALT (SGPT): 30 U/L (ref 7–55)
AST (SGOT): 21 U/L (ref 8–48)
BILIRUBIN, TOTAL: 0.6 mg/dL (ref 0.3–1.3)
BILIRUBIN,CONJUGATED: <0.2 mg/dL (ref 0.0–0.3)
TOTAL PROTEIN: 7.8 g/dL (ref 6.4–8.3)

## 2011-11-17 LAB — GAMMA GT: GAMMA GT: 16 U/L (ref 7–50)

## 2011-11-17 NOTE — Progress Notes (Signed)
11/17/11 1500   Drug Screen Results   Amphetamine (AMP) Negative   Barbiturates (BAR) Negative   Bupenorphine (BUP) Negative   Benzodiazepine (BZO) Negative   Cocaine (COC) Negative   Methamphetamine (mAMP) Negative   Methadone (MTD) Negative   Opiates (OPI) Negative   Oxycodone (OXY) Negative   Marijuana (THC) Negative   Temperature within range? yes   Observed no   Tester cschaefer   Witness dray   Physician sullivan   Lot # [id number]   Expiration Date 12/14   Internal Control Valid yes   Initials cs

## 2011-11-18 ENCOUNTER — Other Ambulatory Visit (HOSPITAL_COMMUNITY): Payer: Self-pay

## 2011-11-18 NOTE — Progress Notes (Signed)
 Case Management Return Patient Note      SUBJECTIVE    Current concerns/problems: Patient Reports: he remains alcohol  and drug free; it is difficult for him due to being at home; he has not found a sponsor but has attended [employer]; he is planning to spend some time in [place]  with a friend to be away from his family    REVIEW OF SYSTEMS    Patient/Other Reports:    CV: Denies problems  Pulmonary: Denies problems  Chills/Sweats/Fever: Denies chills/sweats and Denies fever  Appetite: Experiencing decreased appetite  Bowel/Bladder: Denies problems  Wound (If applicable): N/A  Pain: Denies  Sleep: sleeps 6-7 hours per night on average  Exercise: Currently not exercising  Self Monitoring: N/A  Psychosocial Issues: Alcohol  consumption: has been alcohol  free for about 6 weeks, Tobacco abuse: Smokeless: uses 1 can every 2 days and Cigarettes (PPD): 1 pack per day  and Drug abuse: has been drug free about 6 weeks    OBJECTIVE    MEDICATION RECONCILIATION    Medications: Does not take all medications as prescribed -  Side effects: nightmares from Seroquel    ASPIRIN USE EFFECTIVE CARE MEASURE:  Member is: Excluded from this measure due to age     Recent Vital Signs:   BP Readings from Last 3 Encounters:   11/17/11 132/80   10/06/11 115/75   09/29/11 148/92     Weight:   Wt Readings from Last 3 Encounters:   11/17/11 84.4 kg (186 lb 1.1 oz)   09/29/11 81.647 kg (180 lb)   09/29/11 81.647 kg (180 lb)       CASE MANAGEMENT ASSESSMENT    Case Management Risk Stratification: High Intensity: Current/recent admission    PLAN    PRIORITIZED GOALS:  -  Short Term:  Prevent relapse of drug and alcohol  use  Prevent readmission    Long Term:  Development of self - management action plan with patient/caregiver/ provider.  , Patient and caregiver demonstrate basic understanding of their disease process.  , Exacerbations have been prevented, minimized or reduced in severity. , Co-morbid conditions identified and managed. and Patient/  caregiver demonstrates adherence to treatment plan.      IDENTIFIED BARRIERS:     Poor/inadequate support and per patient family members abuse alcohol  & drugs so do not help patient avoid triggers    CASE MANAGEMENT PLAN OF CARE:   Eeinforced role of case Production designer, theatre/television/film.  Encouraged  to call with any issues or concerns. Gave direct phone number and contact information.  Avoid triggers for drugs and alcohol , attend AA meetings as required; find a sponsor at [employer], attend intense outpatient as scheduled; utilize music or exercise to avoid temptation/triggers   Re-education on plan of care    SELF MANAGEMENT ACTION PLAN: Written plan developed and mailed to member.    CASE MANAGEMENT INTERVENTIONS  Quality Measures: Influenza Vaccine: To be addressed at future visit.  Transition of Care Interventions: Develop/confirm action plan for acute exacerbation.   Medication Interventions: Discussed adherence to medication regimen  Symptom Monitoring Interventions: Not indicated at this time   Coordination of Services/Social Support: No services needed at this time  Behavioral Health Needs Interventions: has schedule for appointments  Safety Management Interventions: Information mailed to member  Life Planning Interventions: Member/caregiver declined referral    Re-evaluation of plan of care and progress towards goals achievement:  Plan to: call patient in 1-2 weeks to reassess and update plan of care, instructed to call Case Manager  or Primary Care Provider with change in symptoms or as needed before next follow-up, verbalizes understanding and agrees with plan    Maxim Bedel  Delana, RN  Outpatient Case Manager

## 2011-11-21 ENCOUNTER — Ambulatory Visit (HOSPITAL_COMMUNITY): Payer: MEDICAID | Admitting: Clinical

## 2011-11-22 ENCOUNTER — Ambulatory Visit (INDEPENDENT_AMBULATORY_CARE_PROVIDER_SITE_OTHER): Payer: 59 | Admitting: Clinical

## 2011-11-22 ENCOUNTER — Ambulatory Visit (INDEPENDENT_AMBULATORY_CARE_PROVIDER_SITE_OTHER): Payer: 59 | Admitting: Psychiatry

## 2011-11-22 VITALS — BP 138/82 | HR 80 | Wt 183.0 lb
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
## 2011-11-22 NOTE — Progress Notes (Addendum)
 ADDICTION IOP MED GROUP  11/22/2011    Patient Name:  Terrance Irwin        MRN:  [id number]    Patient was seen in the Addiction IOP Clinic in the Department of Behavioral Medicine and Psychiatry at [facility] in [place]. This clinic visit included medication management, and evaluation of progress in recovery.    Subjective:    Patient has been sober for 14 days. Patient attended 14 meetings in the past week.    Pt had treatment in the past at [facility] and [facility].  He was in [facility] for 28 days.  He has been out of [facility] for 2 weeks now.  When he got out of [facility] he had a percocet 30.  He has been going twice a day to meetings.  He does not have a sponsor.  He believe he f-ed up a lot and had to make an amense to his mother.  He struggling to doing it.  He plans to take Suboxone  1 film a day.  He does not take Antabuse  anymore.  He has not drank yet.  He other night was tempted to drink some moonshine.     Objective:    Filed Vitals:    11/22/11 1549   BP: 138/82   Pulse: 80   Weight: 83 kg (182 lb 15.7 oz)       Medication side effects:  None  Cravings:  YES  Withdrawal symptoms: None    Patient is in no acute distress, alert and oriented, and dressed appropriately.   Speech is clear and coherent.   Thought process is linear.   Mood is euthymic and affect is congruent.  No indication of responding to internal stimuli.    UDS: Yes        Results:  Negative for all    Assessment: Alcohol  dependence and Polysubstance Dependence    Plan: Continue IOP. Continue current medications.  Suboxone  4 mg BID.  The problem list was reviewed, however it was not updated or discussed with the patient today.    Rollin Leontine GAILS, MD 11/22/2011, 4:24 PM  I saw and evaluated the patient on the date of service with the resident. I agree with the findings and plan of care as documented in the resident's note.  Any exceptions/additions are edited/noted.   urine drug screen negative for all drugs on 11/22/11  [name],  MD 11/24/2011, 6:40 PM
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
## 2011-11-22 NOTE — Progress Notes (Signed)
 11/22/11 1600   Drug Screen Results   Amphetamine  (AMP) Negative   Barbiturates (BAR) Negative   Bupenorphine (BUP) Negative   Benzodiazepine (BZO) Negative   Cocaine  (COC) Negative   Methamphetamine (mAMP) Negative   Methadone (MTD) Negative   Opiates (OPI) Negative   Oxycodone (OXY) Negative   Marijuana (THC) Negative   Temperature within range? yes   Observed no   Tester sutton   Witness dray   Physician floretta   Lot # [id number]   Expiration Date 12/14   Internal Control Valid yes   Initials cls

## 2011-11-24 ENCOUNTER — Ambulatory Visit (INDEPENDENT_AMBULATORY_CARE_PROVIDER_SITE_OTHER): Payer: 59 | Admitting: Clinical

## 2011-11-25 ENCOUNTER — Other Ambulatory Visit (HOSPITAL_BASED_OUTPATIENT_CLINIC_OR_DEPARTMENT_OTHER): Payer: Self-pay

## 2011-11-25 NOTE — Progress Notes (Signed)
Placed call to patient.   Left message on voicemail. Requested return call at my direct extension.     Zayd Bonet, RN  Outpatient Case Manager  Family Medicine  [facility]
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
## 2011-11-28 ENCOUNTER — Ambulatory Visit (INDEPENDENT_AMBULATORY_CARE_PROVIDER_SITE_OTHER): Payer: 59 | Admitting: Clinical

## 2011-11-29 ENCOUNTER — Ambulatory Visit (INDEPENDENT_AMBULATORY_CARE_PROVIDER_SITE_OTHER): Payer: 59 | Admitting: Clinical

## 2011-11-29 ENCOUNTER — Ambulatory Visit (INDEPENDENT_AMBULATORY_CARE_PROVIDER_SITE_OTHER): Payer: 59 | Admitting: Psychiatry
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
## 2011-11-29 NOTE — Progress Notes (Signed)
Terrance Irwin    ADDICTION INTENSIVE OUTPATIENT PROGRAM  FAMILY THERAPY GROUP PROGRESS NOTE    DATE OF SERVICE: 11/29/11  DURATION:  90 Minutes             TIME:  5-6:30 pm   NUMBER IN GROUP: 12 group members and 7 family members  CHIEF COMPLAINT: Addiction    SUBJECTIVE: Terrance Irwin attended group without family members. The focus of group today was on communication. The group discussed the differences between good communication and bad communication and observed the two forms of communication during role plays with group members. The group discussed the benefits of communication and there was a discussion about the role of the listener in conversations. There was also a discussion about the different communication styles depending on gender. The group divided into groups of 4 to practice being the listener and the talker in a conversation. 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 He shared that he tries to avoid conflict at all costs. Terrance Irwin shared that he thinks he has anger problems.  He shared how this manifests in his relationships. Discussed how anger can make listening and talking difficult.     OBSERVATION:  Mood: Dysphoric  Affect: Flat  Thought processes: Cogent and eye contact is good.  Participation: Minimally active.    ASSESSMENT: 304.80 Polysubstance Dependence, 303.90 Alcohol Dependence    PROCEDURE:  Multi-Family therapy group - explored family dynamics, the effects of addiction on the family and the family's role in the addictive process.    PLAN:  Abstinence, self help meetings, take medication as prescribed.    Loel Dubonnet, LICSW

## 2011-11-29 NOTE — Addendum Note (Signed)
Addended by: Janeece Fitting on: 11/29/2011 10:45 AM     Modules accepted: Level of Service

## 2011-11-29 NOTE — Progress Notes (Signed)
 [name]    ADDICTION INTENSIVE OUTPATIENT PROGRAM  THERAPY PROGRESS NOTE    DATE OF SERVICE: 11/28/11  DURATION:  90 minutes             TIME:  4-5:30 pm   NUMBER IN GROUP: 11 group members  CHIEF COMPLAINT: Addiction    SUBJECTIVE: Terrance Irwin has 21 days clean. Terrance Irwin apologized for coming into group so late and shared that he was sleeping because he has been working nights.    OBSERVATION:  Mood: Euthymic  Affect: Flat  Thought processes: Cogent and eye contact is good  Participation:  Minimally active    ASSESSMENT: 304.80 Polysubstance Dependence, 303.90 Alcohol  Dependence    P. Group therapy    P. Continue group    Lamar Hicks, Associate Professor

## 2011-12-01 ENCOUNTER — Ambulatory Visit (HOSPITAL_COMMUNITY): Payer: 59 | Admitting: Clinical

## 2011-12-01 ENCOUNTER — Encounter (HOSPITAL_COMMUNITY): Payer: 59 | Admitting: Clinical Social Worker

## 2011-12-05 ENCOUNTER — Ambulatory Visit (HOSPITAL_COMMUNITY): Payer: 59 | Admitting: Clinical

## 2011-12-06 ENCOUNTER — Ambulatory Visit (HOSPITAL_COMMUNITY): Payer: MEDICAID | Admitting: Psychiatry

## 2011-12-06 ENCOUNTER — Encounter (HOSPITAL_COMMUNITY): Payer: Self-pay | Admitting: Psychiatry

## 2011-12-06 ENCOUNTER — Encounter (HOSPITAL_COMMUNITY): Payer: 59 | Admitting: Clinical Social Worker

## 2011-12-06 ENCOUNTER — Ambulatory Visit (HOSPITAL_COMMUNITY): Payer: MEDICAID | Admitting: Clinical

## 2011-12-06 NOTE — Progress Notes (Addendum)
DISCHARGE NOTE  __X__Addiction Intensive Outpatient Program  ____Suboxone Clinic  Admission Date: 11/21/11  Discharge Date: 12/05/11    Final Diagnosis  Axis I: 304.80 Polysubstance Dependency, ADHD  Axis II: No diagnosis  Axis III: Migraines  Axis IV: Family Stressors, Environmental Stressors, Occupational Stressors, Social Stressors  Axis V: 55    Reason for Discharge: Jemar was discharged from the Addiction Intensive Outpatient Program (AIOP) after two consecutive no shows.    Follow up appointments: None    Follow up recommendations: Abstinence, attendance at 12-step meetings and work with sponsor.    Georgeann Oppenheim  Case Manager   Social Work Graduate Intern   Outpatient Addiction Services   [facility]    Danford Bad, Associate Professor

## 2011-12-08 ENCOUNTER — Ambulatory Visit (HOSPITAL_COMMUNITY): Payer: MEDICAID | Admitting: Clinical

## 2011-12-12 ENCOUNTER — Ambulatory Visit (HOSPITAL_COMMUNITY): Payer: MEDICAID | Admitting: Clinical

## 2011-12-13 ENCOUNTER — Ambulatory Visit (HOSPITAL_COMMUNITY): Payer: MEDICAID | Admitting: Clinical

## 2011-12-13 ENCOUNTER — Ambulatory Visit (HOSPITAL_COMMUNITY): Payer: MEDICAID | Admitting: Psychiatry

## 2012-12-24 IMAGING — CR DG CHEST 2V
2 series · 2 of 2 positions shown · non-contrast
Comparison: None.

CLINICAL DATA: Left chest pain for 1 week.

CHEST - 2 VIEW

[w chest pa]
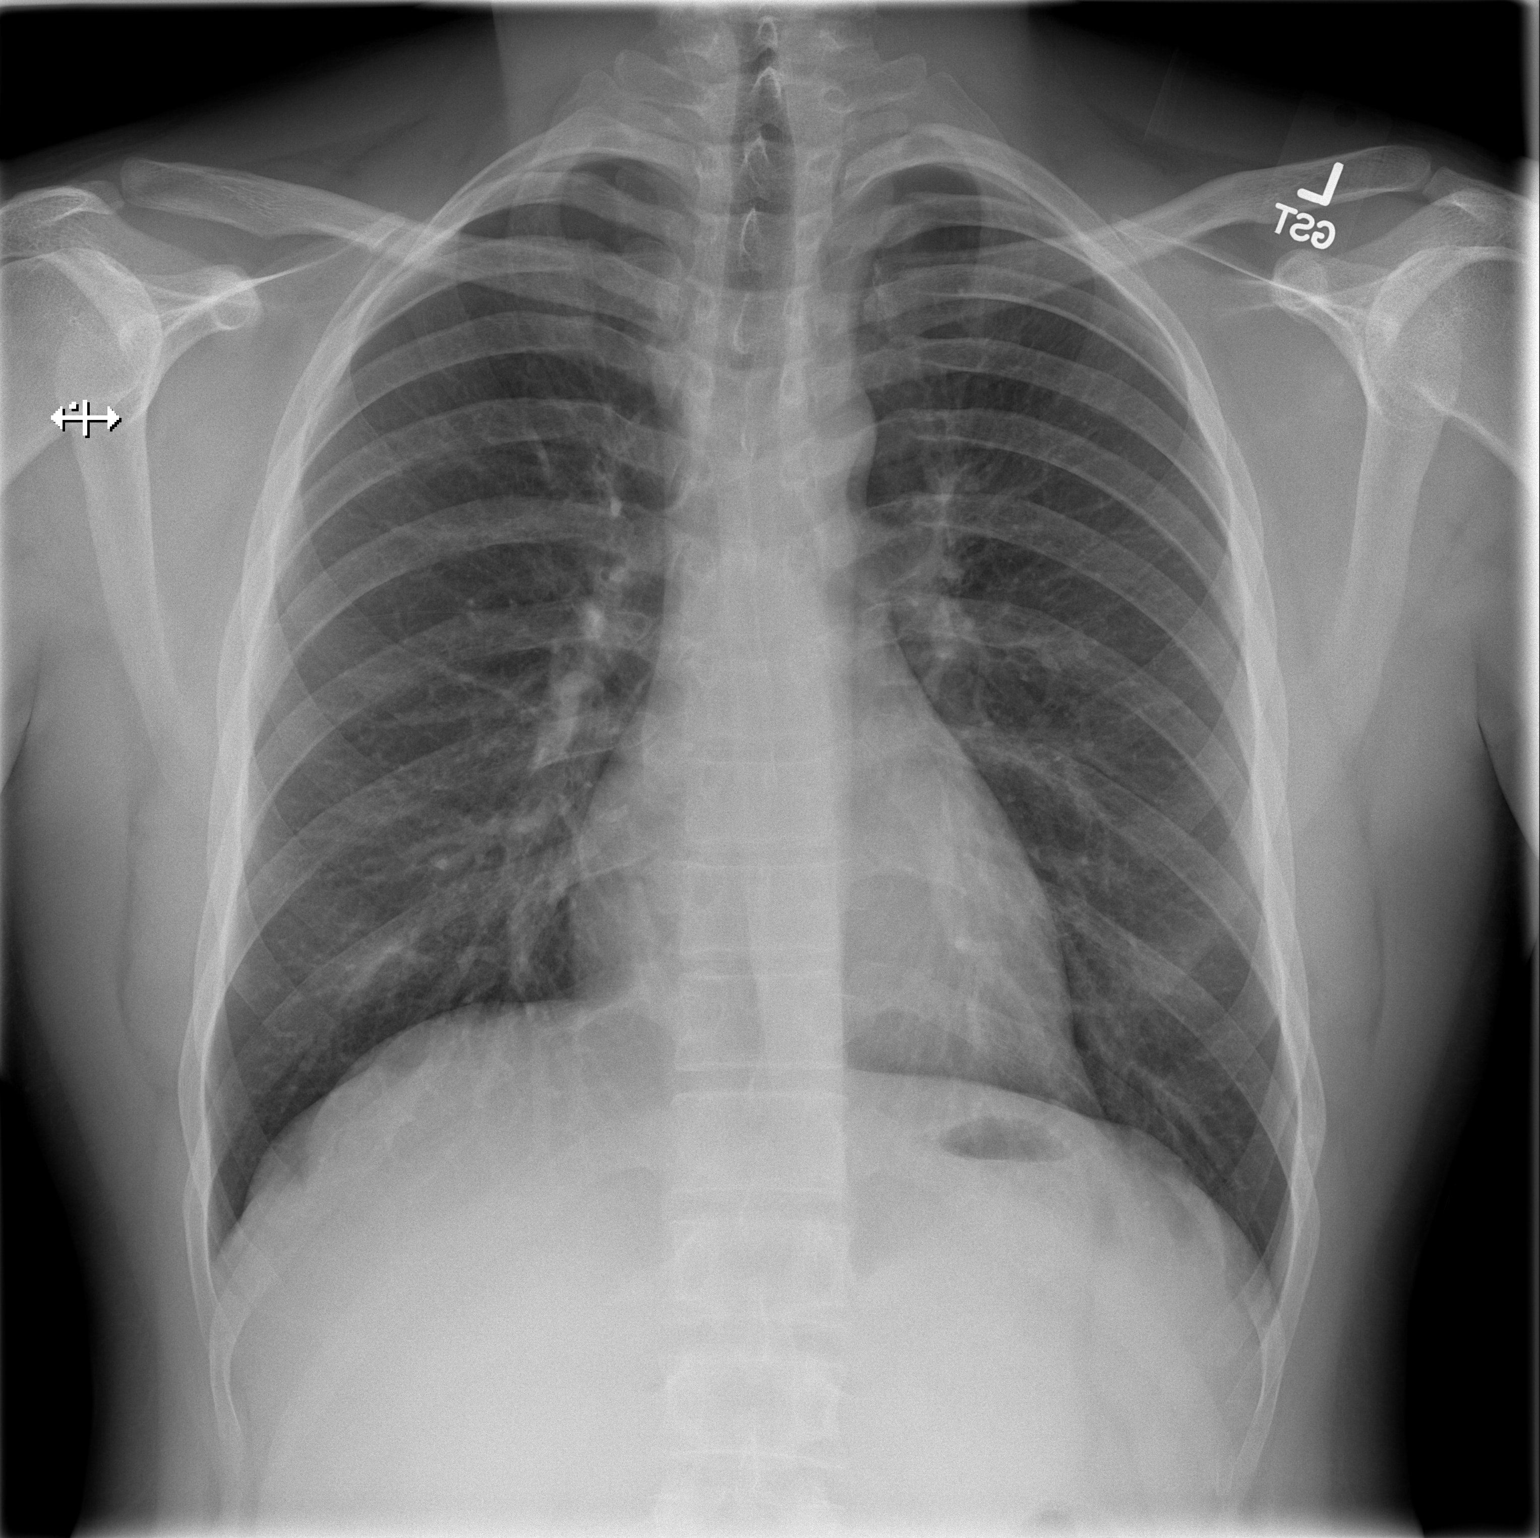

[w chest lat]
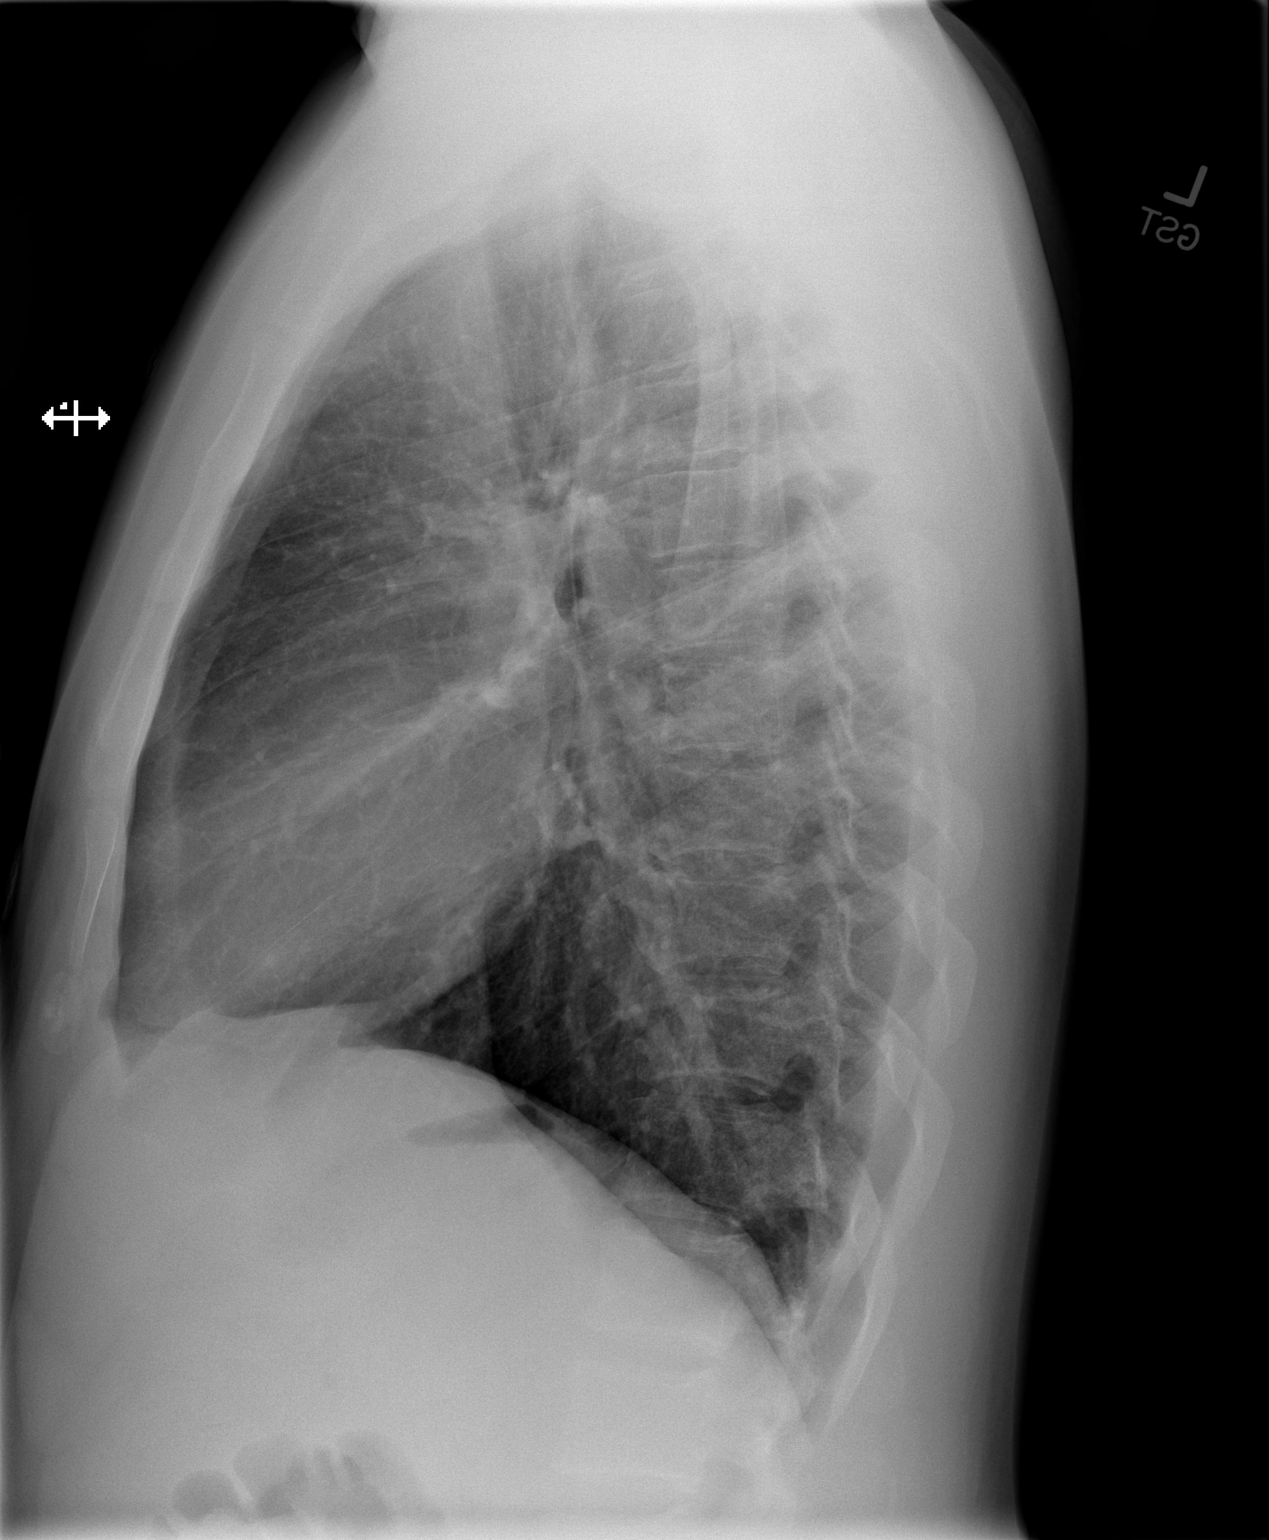

[2 of 2 positions shown; findings below may reference images not displayed]

FINDINGS: The heart size and pulmonary vascularity are normal. The
lungs appear clear and expanded without focal air space disease or
consolidation. No blunting of the costophrenic angles.  No
pneumothorax.  Mediastinal contours appear intact.
IMPRESSION: No evidence of active pulmonary disease.

## 2017-06-15 ENCOUNTER — Ambulatory Visit
Payer: Worker's Comp, Other unspecified | Attending: ORTHOPEDIC, SPORTS MEDICINE | Admitting: ORTHOPEDIC, SPORTS MEDICINE

## 2017-06-15 ENCOUNTER — Ambulatory Visit
Admission: RE | Admit: 2017-06-15 | Discharge: 2017-06-15 | Disposition: A | Discharge status: Home / Self Care | Payer: MEDICAID | Source: Ambulatory Visit | Attending: ORTHOPEDIC, SPORTS MEDICINE | Admitting: ORTHOPEDIC, SPORTS MEDICINE

## 2017-06-15 ENCOUNTER — Encounter (HOSPITAL_BASED_OUTPATIENT_CLINIC_OR_DEPARTMENT_OTHER): Payer: Self-pay | Admitting: ORTHOPEDIC, SPORTS MEDICINE

## 2017-06-15 VITALS — BP 133/70 | HR 80 | Temp 97.9°F | Ht 68.9 in | Wt 226.4 lb

## 2017-06-15 DIAGNOSIS — W228XXA Striking against or struck by other objects, initial encounter: Secondary | ICD-10-CM | POA: Insufficient documentation

## 2017-06-15 DIAGNOSIS — S9032XA Contusion of left foot, initial encounter: Secondary | ICD-10-CM | POA: Insufficient documentation

## 2017-06-15 MED ORDER — NAPROXEN 500 MG TABLET
500.00 mg | ORAL_TABLET | Freq: Two times a day (BID) | ORAL | 3 refills | Status: DC
Start: 2017-06-15 — End: 2018-10-31

## 2017-06-16 NOTE — Progress Notes (Deleted)
PATIENT NAME: Terrance Irwin, Terrance Irwin [facility] NUMBER:  [id number]  DATE OF SERVICE: 06/15/2017  DATE OF BIRTH:  1990-11-21    PROGRESS NOTE    Date of Injury:  Jun 03, 2017.   Claim #: [id number].    HISTORY OF PRESENT ILLNESS:  Mr. Neely presents for evaluation of a left foot injury.  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  He was immediately evaluated in the Emergency Department in [place], [place].  Per his report, he was told that there was no fracture and was advised to follow up with occupational medicine the following Monday.  When he went there, he was told that there was a fracture.  He followed up with the third physician and was then told that there was not a fracture.  Unfortunately, the initial films are not available for review.  He has been in a Cam walker and reports that walking in a CAM walker he is doing well.  He still notes a lot of swelling over the top of the foot and pain.  He is currently using a combination of naproxen,  Tylenol and ibuprofen, which he feels has helped control his pain.  It sounds as though he is taking 500 mg of naproxen twice a day and doing 600 mg of ibuprofen along with Tylenol 2 or 3 times a day.    PAST MEDICAL HISTORY:  Unremarkable.    PAST SURGICAL HISTORY:  None.    ALLERGIES:  No known drug allergies.    SOCIAL HISTORY:  He is employed as a Corporate investment banker.  He currently is doing light duties.    FAMILY HISTORY:  Noncontributory.    REVIEW OF SYSTEMS:  Positive for foot pain and swelling as above.  All other systems are negative.    PHYSICAL EXAMINATION:  He is afebrile.  Blood pressure 133/70, pulse 80.  Left foot:  There is a small abrasion over the dorsum of the midfoot.  There is significant soft tissue swelling over the midfoot.  He is point tender over the base of the first and second metatarsal and a little bit more tender actually over the  distal aspect of the first and second metatarsal and over the first and second MTP joint.  He has limited active flexion of the MTP joint.  He maintains full active extension.    IMAGING:  Radiographs of the left foot were obtained today and demonstrate normal bony alignment, no acute bony abnormalities.  There is soft tissue swelling noted.    IMPRESSION:  Left foot contusion.    PLAN:  I think he has a deep bony contusion given the amount of force in his injury. This may take 6-8 weeks or occasionally longer to heal.  As he has significant swelling and he is unable to wear normal shoe wear or work boots, he should continue with the CAM walker.  We will re-evaluate him in approximately 2 weeks.  He should continue with restricted work duties.  He must be in the a walking boot.  No prolonged standing or walking.  No lifting greater than 10 pounds.        Fabiola Backer, MD              CC:   [employer]   [address]   [place], [place] [address]  DD:  06/15/2017 16:52:46  DT:  06/16/2017 08:54:55 CB  D#:  [id number]

## 2017-06-19 NOTE — Progress Notes (Signed)
PATIENT NAME: Terrance Irwin, [facility] NUMBER:  [id number]  DATE OF SERVICE: 06/15/2017  DATE OF BIRTH:  07/27/1990    PROGRESS NOTE    Date of Injury: Jun 03, 2017.   Claim #: [id number].    HISTORY OF PRESENT ILLNESS:  [name] presents for evaluation of a left foot injury. 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 He was immediately evaluated in the Emergency Department in [place], [place]. Per his report, he was told that there was no fracture and was advised to follow up with occupational medicine the following Monday. When he went there, he was told that there was a fracture. He followed up with the third physician and was then told that there was not a fracture. Unfortunately, the initial films are not available for review. He has been in a Cam walker and reports that walking in a CAM walker he is doing well. He still notes a lot of swelling over the top of the foot and pain. He is currently using a combination of naproxen, Tylenol and ibuprofen, which he feels has helped control his pain. It sounds as though he is taking 500 mg of naproxen twice a day and doing 600 mg of ibuprofen along with Tylenol 2 or 3 times a day.    PAST MEDICAL HISTORY:  Unremarkable.    PAST SURGICAL HISTORY:  None.    ALLERGIES:  No known drug allergies.    SOCIAL HISTORY:  He is employed as a Corporate investment banker. He currently is doing light duties.    FAMILY HISTORY:  Noncontributory.    REVIEW OF SYSTEMS:  Positive for foot pain and swelling as above. All other systems are negative.    PHYSICAL EXAMINATION:  He is afebrile. Blood pressure 133/70, pulse 80. Left foot: There is a small abrasion over the dorsum of the midfoot. There is significant soft tissue swelling over the midfoot. He is point tender over the base of the first and second metatarsal and a little bit more tender actually over the distal aspect of  the first and second metatarsal and over the first and second MTP joint. He has limited active flexion of the MTP joint. He maintains full active extension.    IMAGING:  Radiographs of the left foot were obtained today and demonstrate normal bony alignment, no acute bony abnormalities. There is soft tissue swelling noted.    IMPRESSION:  Left foot contusion.    PLAN:  I think he has a deep bony contusion given the amount of force in his injury. This may take 6-8 weeks or occasionally longer to heal. As he has significant swelling and he is unable to wear normal shoe wear or work boots, he should continue with the CAM walker. We will re-evaluate him in approximately 2 weeks. He should continue with restricted work duties. He must be in the a walking boot. No prolonged standing or walking. No lifting greater than 10 pounds.        Hilda Lias, MD  Assistant Professor   [facility] Department of Orthopaedics            CC:   [facility]   [address]   [place], [place] [address]       DD:  06/15/2017 16:52:46  DT:  06/16/2017 08:54:55 CB  D#:  [id number]

## 2017-06-29 ENCOUNTER — Encounter (HOSPITAL_BASED_OUTPATIENT_CLINIC_OR_DEPARTMENT_OTHER): Payer: Worker's Comp, Other unspecified | Admitting: ORTHOPEDIC, SPORTS MEDICINE

## 2017-06-30 ENCOUNTER — Encounter (HOSPITAL_BASED_OUTPATIENT_CLINIC_OR_DEPARTMENT_OTHER): Payer: Self-pay | Admitting: ORTHOPEDIC, SPORTS MEDICINE

## 2017-06-30 ENCOUNTER — Ambulatory Visit
Payer: Worker's Comp, Other unspecified | Attending: ORTHOPEDIC, SPORTS MEDICINE | Admitting: ORTHOPEDIC, SPORTS MEDICINE

## 2017-06-30 VITALS — BP 142/91 | HR 84 | Temp 98.1°F

## 2017-06-30 DIAGNOSIS — S9032XD Contusion of left foot, subsequent encounter: Principal | ICD-10-CM | POA: Insufficient documentation

## 2017-06-30 DIAGNOSIS — W230XXD Caught, crushed, jammed, or pinched between moving objects, subsequent encounter: Secondary | ICD-10-CM | POA: Insufficient documentation

## 2017-06-30 DIAGNOSIS — S93602D Unspecified sprain of left foot, subsequent encounter: Secondary | ICD-10-CM

## 2017-07-14 ENCOUNTER — Ambulatory Visit
Payer: Worker's Comp, Other unspecified | Attending: ORTHOPEDIC, SPORTS MEDICINE | Admitting: ORTHOPEDIC, SPORTS MEDICINE

## 2017-07-14 VITALS — BP 145/84 | HR 86 | Temp 98.1°F

## 2017-07-14 DIAGNOSIS — S9032XD Contusion of left foot, subsequent encounter: Principal | ICD-10-CM | POA: Insufficient documentation

## 2017-07-14 NOTE — Progress Notes (Signed)
PATIENT NAME: Terrance ParentsMOORE, [facility]  [facility]PITAL NUMBER:  [id number]  DATE OF SERVICE: 07/14/2017  DATE OF BIRTH:  [birth year]    PROGRESS NOTE    DATE OF INJURY:  Jun 03, 2017.    CLAIM NUMBER:  [id number].    SUBJECTIVE:  Mr. Terrance ParentsDamian Blunck presents for followup of his left foot injury.  Again, he had a crush injury and foot contusion when a large piece of rail landed across his foot.  When I saw him previously, he was feeling significantly better.  He wanted to try to go back to work and returned to work.  He feels that his work boots rub at the top of his foot and he is a little bit sore across the top of the foot where the work boot is, otherwise, he does not have a lot of pain but unfortunately feels that the pain has continued and it is not improving.  By the end of the day, he is walking much slower.  He does not feel that he is limping but is walking slower and has pain.    PAST MEDICAL HISTORY:  Reviewed and unchanged.    OBJECTIVE:  On examination, he is afebrile.  Blood pressure 145/84, pulse 86, temperature 36.7 degrees Celsius.  Left foot:  He is tender to palpation across the mid foot just minimally and squeeze test is negative.  Shift test is negative.  Gait is normal.  He is neurologically intact to motor and sensation.  Capillary refill is brisk.    IMPRESSION:  Left foot contusion.    PLAN:  We discussed possibly taking him out of his work and putting him back in the Cam boot to allow his this to heal a little bit more.  He really would like to continue with his current job, although he notes that it is   aggravating his foot.  We will monitor him closely over the next several weeks.  We will plan to see him back in 3-4 weeks.  If worsening, then we will need to place him back on light duties.        Hilda LiasBenjamin Lota Leamer, MD  Assistant Professor   [facility] Department of Orthopaedics            CC:   [facility]   [address]   [place], [place] [phone]       DD:  07/14/2017 12:28:41  DT:  07/14/2017  22:24:55 FP  D#:  [id number]

## 2017-07-15 NOTE — Progress Notes (Signed)
Name: Terrance ReiningDamian Thomas Irwin  MEDICAL RECORD [id number]  DATE OF BIRTH: 1990-02-04  Claim Number: [id number]    SUBJECTIVE:  Terrance Irwin presents to clinic today for follow up of Foot Pain  He is following up from his left foot crush injury.  He has been in a CAM walker.  He tells me he has come out of it just a little bit and is starting to feel comfortable with wearing his boots.  He does bring his work boot with him.  It is noted that the sole is very flexible.      PAST MEDICAL HISTORY  Reviewed and updated    MEDICATIONS  Current Outpatient Medications   Medication Sig   . buPROPion [facility](WELLBUTRIN SR) 150 mg Oral tablet sustained-release 12 hr Take 150 mg by mouth Twice daily   . naproxen (NAPROSYN) 500 mg Oral Tablet Take 1 Tab (500 mg total) by mouth Twice daily with food     ALLERGIES  No Known Allergies  REVIEW OF SYSTEMS  As above, otherwise all other systems are essentially negative.    PHYSICAL EXAM  BP (!) 142/91   Pulse 84   Temp 36.7 C (98.1 F)      In no acute distress with appropriate mood and affect.  he is alert and oriented times three.  Respirations are unlabored.  Pulses palpable and capillary refill is within normal limits.    Skin is warm and dry to touch.    Left foot:  He is mildly tender across the midfoot and Lisfranc joint.  He has minimal discomfort with the forced motion of the midfoot.  He is nontender across the MTP joints or at the ankle.    ASSESSMENT:  1. Contusion of left foot, subsequent encounter    2. Foot sprain, left, subsequent encounter      PLAN:  At today's visit, we discussed treatment options.  He seems very eager to get back to work.  He feels that he will be able to manage with this.  I think we need to get him some the little bit more supportive across his shoe.  Recommend the use of a rigid shoe insert to protect the midfoot.  He was fitted with this today.  He will follow up in about 3 weeks to see how he has done with return to work.    .  If the patient has any  questions or concerns, he can contact our office.      Hilda LiasBenjamin Kenji Mapel, MD 07/15/2017, 09:24

## 2017-08-11 ENCOUNTER — Encounter (HOSPITAL_BASED_OUTPATIENT_CLINIC_OR_DEPARTMENT_OTHER): Payer: Self-pay | Admitting: ORTHOPEDIC, SPORTS MEDICINE

## 2017-08-11 ENCOUNTER — Ambulatory Visit
Payer: Worker's Comp, Other unspecified | Attending: ORTHOPEDIC, SPORTS MEDICINE | Admitting: ORTHOPEDIC, SPORTS MEDICINE

## 2017-08-11 VITALS — BP 121/78 | HR 73 | Temp 96.8°F

## 2017-08-11 DIAGNOSIS — X58XXXD Exposure to other specified factors, subsequent encounter: Secondary | ICD-10-CM | POA: Insufficient documentation

## 2017-08-11 DIAGNOSIS — S9032XD Contusion of left foot, subsequent encounter: Secondary | ICD-10-CM | POA: Insufficient documentation

## 2017-08-12 NOTE — Progress Notes (Signed)
PATIENT NAME: [name][facility]  [facility]OSPITAL NUMBER:  [id number]  DATE OF SERVICE: 08/11/2017  DATE OF BIRTH:  [birth year]    PROGRESS NOTE    DATE OF INJURY:  Jun 03, 2017.    CLAIM NUMBER:  [id number].    SUBJECTIVE:  Mr. Christell ConstantMoore presents for followup of his left foot injury.  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  Since I have seen him, he has continued with his work.  He has had gradual improvement of symptoms and at this point feels that he is basically pain free.  He is not having any swelling or other symptoms.  He has continued with full work duties without any difficulties.    OBJECTIVE:  On examination, gait is normal.  Left foot:  There is no focal swelling noted.  He is maybe minimally tender at the midfoot, but there is no instability.  Piano key test is negative.  Squeeze test is negative.  He has full range of motion and full strength of the foot and ankle.    IMPRESSION:  Left foot contusion.    PLAN:  At this point, we have released him from medical care.  He is cleared for full activities.  I would anticipate no further problems.        Hilda LiasBenjamin Jovonne Wilton, MD  Assistant Professor   [facility] Department of Orthopaedics            CC:   [facility]   [address]   [place], [place] [phone]       DD:  08/11/2017 15:54:10  DT:  08/12/2017 11:37:34 TM  D#:  [id number]

## 2018-01-17 ENCOUNTER — Ambulatory Visit (INDEPENDENT_AMBULATORY_CARE_PROVIDER_SITE_OTHER): Payer: Self-pay

## 2018-01-19 ENCOUNTER — Ambulatory Visit (INDEPENDENT_AMBULATORY_CARE_PROVIDER_SITE_OTHER): Payer: Self-pay

## 2018-01-19 DIAGNOSIS — Z0000 Encounter for general adult medical examination without abnormal findings: Secondary | ICD-10-CM

## 2018-01-19 NOTE — Progress Notes (Signed)
Patient here for work-related exam.  See documentation in paper chart.    Amy L McCray 01/19/2018, 14:26

## 2018-02-01 ENCOUNTER — Ambulatory Visit (INDEPENDENT_AMBULATORY_CARE_PROVIDER_SITE_OTHER): Payer: Self-pay

## 2018-02-01 DIAGNOSIS — Z0000 Encounter for general adult medical examination without abnormal findings: Secondary | ICD-10-CM

## 2018-02-06 ENCOUNTER — Encounter (HOSPITAL_BASED_OUTPATIENT_CLINIC_OR_DEPARTMENT_OTHER): Payer: Worker's Comp, Other unspecified | Admitting: ORTHOPEDIC, SPORTS MEDICINE

## 2018-10-31 ENCOUNTER — Encounter (HOSPITAL_COMMUNITY): Payer: Self-pay

## 2018-10-31 ENCOUNTER — Emergency Department (HOSPITAL_COMMUNITY): Payer: Self-pay

## 2018-10-31 ENCOUNTER — Emergency Department
Admission: EM | Admit: 2018-10-31 | Discharge: 2018-10-31 | Disposition: A | Discharge status: Home / Self Care | Payer: Medicaid Other | Attending: Emergency Medicine | Admitting: Emergency Medicine

## 2018-10-31 ENCOUNTER — Other Ambulatory Visit: Payer: Self-pay

## 2018-10-31 DIAGNOSIS — R519 Headache, unspecified: Secondary | ICD-10-CM

## 2018-10-31 DIAGNOSIS — R51 Headache: Secondary | ICD-10-CM | POA: Insufficient documentation

## 2018-10-31 MED ORDER — METOCLOPRAMIDE 5 MG/ML INJECTION SOLUTION
10.00 mg | INTRAMUSCULAR | Status: AC
Start: 2018-10-31 — End: 2018-10-31
  Administered 2018-10-31: 10 mg via INTRAVENOUS
  Filled 2018-10-31: qty 2

## 2018-10-31 MED ORDER — ELECTROLYTE-A INTRAVENOUS SOLUTION BOLUS
1000.0000 mL | Freq: Once | INTRAVENOUS | Status: AC
Start: 2018-10-31 — End: 2018-10-31
  Administered 2018-10-31: 0 mL via INTRAVENOUS
  Administered 2018-10-31: 1000 mL via INTRAVENOUS

## 2018-10-31 MED ORDER — DIPHENHYDRAMINE 50 MG/ML INJECTION SOLUTION
25.00 mg | INTRAMUSCULAR | Status: AC
Start: 2018-10-31 — End: 2018-10-31
  Administered 2018-10-31: 25 mg via INTRAVENOUS
  Filled 2018-10-31: qty 1

## 2018-10-31 MED ORDER — KETOROLAC 30 MG/ML (1 ML) INJECTION SOLUTION
30.00 mg | INTRAMUSCULAR | Status: AC
Start: 2018-10-31 — End: 2018-10-31
  Administered 2018-10-31: 30 mg via INTRAVENOUS
  Filled 2018-10-31: qty 1

## 2018-10-31 NOTE — ED Nurses Note (Signed)
Discharge instructions reviewed with pt. No concerns noted. Pt verbalized understanding. PIV removed, dressing applied, bleeding controlled. Pt lucid and ambulated to the exit without assistance

## 2018-10-31 NOTE — ED Nurses Note (Signed)
Pt resting quietly in bed, eyes closed. Call light within reach. Chest rise and fall visualized, respirations WDL.

## 2018-10-31 NOTE — ED Nurses Note (Signed)
Pt resting quietly in bed, eyes closed. Chest rise and fall observed, respirations WDL. Call bell within reach.

## 2018-10-31 NOTE — ED Nurses Note (Signed)
Pt ambulated to ER33 from triage. Pt stated he has been suffering from an orbital HA x 2 days. Pt stated he takes Imitrex but did not take any today. Pt stated he is also out of his Adderall and needs a refill. Pt stated his boss told him he needs a work excuse to be able to return to work. Denies nausea, vomiting, fevers, or chills. Pt is alert and able to verbalize needs. Call bell is within reach. Will continue to monitor.

## 2018-10-31 NOTE — ED Nurses Note (Signed)
No needs noted. Will continue to monitor.

## 2018-10-31 NOTE — ED Nurses Note (Signed)
Pt resting in bed with call bell in reach. Plan to dispo pt once awake

## 2018-11-02 NOTE — ED Attending Note (Signed)
Patient seen primarily by attending.  Please see primary note of same date for details.  Terrance Jansson Stephen Lamari Beckles, MD

## 2018-11-02 NOTE — ED Provider Notes (Signed)
28 year old male with past medical history significant for migraine-type cephalgia, currently out of his medications with states that he has access to them has already called in refills presents emergency department with several day history of gradually worsening cephalgia that he states is identical in nature and severity as well as location to his prior episodes of cephalgia.  He denies any fever, chills, neck pain, focal numbness or weakness.              Review of Systems   Constitutional: Negative for chills and fever.   Respiratory: Negative for shortness of breath.    Cardiovascular: Negative for chest pain.   All other systems reviewed and are negative.      Past medical history:  Nursing notes reviewed and agree with nursing notes  Medications:  Nursing notes reviewed and agree with nursing notes  Social history:  Nursing notes reviewed and agree with nursing notes  Surgical history:  Nursing notes reviewed and agree with nursing notes  Allergies:  Nursing notes reviewed and agree with nursing notes      Physical Exam   Constitutional:   Patient is awake, alert, and able to converse regarding multiple topics without evidence of confusion or altered mental status.   HENT:   Head: Normocephalic and atraumatic.   Eyes: Pupils are equal, round, and reactive to light. EOM are normal.   Neck:   Patient is observed moving his neck easily in all directions without any evidence of meningismus   Cardiovascular: Normal rate and regular rhythm.   No murmur heard.  Pulmonary/Chest: Effort normal. No respiratory distress. He has no wheezes. He has no rales.   Abdominal: Soft. He exhibits no distension. There is no abdominal tenderness. There is no rebound and no guarding.   Neurological:   Patient is able to converse regarding multiple topics without evidence of confusion or altered mental status.  He is moving his neck easily in all directions without meningismus, there is 5/5 strength throughout the upper lower  extremities without evidence of pronator drift or dysmetria.  Gait was assessed and was entirely normal.   Skin: Skin is warm and dry. No rash noted.   Psychiatric: He has a normal mood and affect. His behavior is normal.   Nursing note and vitals reviewed.             Course  Patient seen and examined.  Given his overall well appearance, lack of meningeal signs, gradual onset of his headache, similar to prior episodes, there was no clinical concern for central nervous system infection such as meningitis encephalitis or subarachnoid hemorrhage.  Patient was medicated with cephalgia cocktail had complete resolution of his symptoms, he was dismissed to follow up as needed with primary care physician.

## 2019-10-28 ENCOUNTER — Other Ambulatory Visit: Payer: Self-pay

## 2019-10-28 ENCOUNTER — Emergency Department
Admission: EM | Admit: 2019-10-28 | Discharge: 2019-10-28 | Disposition: A | Discharge status: Home / Self Care | Payer: Self-pay | Attending: Obstetrics & Gynecology | Admitting: Obstetrics & Gynecology

## 2019-10-28 ENCOUNTER — Emergency Department (HOSPITAL_COMMUNITY): Payer: Self-pay

## 2019-10-28 DIAGNOSIS — Z20822 Contact with and (suspected) exposure to covid-19: Secondary | ICD-10-CM | POA: Insufficient documentation

## 2019-10-28 DIAGNOSIS — J069 Acute upper respiratory infection, unspecified: Secondary | ICD-10-CM | POA: Insufficient documentation

## 2019-10-28 DIAGNOSIS — G43909 Migraine, unspecified, not intractable, without status migrainosus: Secondary | ICD-10-CM | POA: Insufficient documentation

## 2019-10-28 DIAGNOSIS — Z8616 Personal history of COVID-19: Secondary | ICD-10-CM | POA: Insufficient documentation

## 2019-10-28 DIAGNOSIS — B9789 Other viral agents as the cause of diseases classified elsewhere: Secondary | ICD-10-CM | POA: Insufficient documentation

## 2019-10-28 DIAGNOSIS — Z720 Tobacco use: Secondary | ICD-10-CM | POA: Insufficient documentation

## 2019-10-28 LAB — COVID-19 ~~LOC~~ MOLECULAR LAB TESTING: SARS-CoV-2: NOT DETECTED

## 2019-10-28 NOTE — ED Provider Notes (Signed)
Department of Emergency Medicine   ED Provider Note - 10/28/2019      Attending: Geradine Girt, MD  Resident/APP: Rob Hickman, APRN, NP-C     Chief Complaint: Sinus Congestion    HPI:   Terrance Irwin, 29 y.o., male presents to the ED today via POV with complaints of sinus congestion with associated ear pain and headache. The pt states that he has had a "head cold" for the past 2 days. He states that he is here because his employer wants him cleared for work. He reports that he had COVID-19 and it does not feel similar and feels like a normal "head cold". The pt states that he has taken Sudafed and nasal spray with improvement. Of note, the pt states that he is not vaccinated for COVID-19. NKDA. Per chart review, pertinent PMHx includes migraines.     History limitations: None      Review of Systems:   Constitutional: No fever, chills, or weakness  Skin: No rash or diaphoresis   HENT: +congestion +headache +ear pain  Eyes: No blurry vision, double vision, or photophobia  Cardio: No chest pain, palpitations, or lower extremity swelling  Respiratory: No cough, sputum production, wheezing, or shortness of breath  GI: No nausea, vomiting, diarrhea, constipation, or abdominal pain  GU: No dysuria, hematuria, or polyuria   MSK: No muscle aches, extremity pain, or back pain  Neuro: No numbness, tingling, focal weakness, or LOC  All other systems are reviewed and are negative.       Medications:  Prior to Admission Medications   Prescriptions Last Dose Informant Patient Reported? Taking?   dextroamphetamine-amphetamine (ADDERALL) 10 mg Oral Tablet   Yes No   Sig: Take 10 mg by mouth Once a day   sumatriptan succinate (IMITREX) 100 mg Oral Tablet   Yes No   Sig: Take 100 mg by mouth Once, as needed for Migraine May repeat in 2 hours in needed      Facility-Administered Medications: None       Allergies:  No Known Allergies    Past Medical History:  Past Medical History:   Diagnosis Date    ADHD (attention deficit  hyperactivity disorder)     Headaches 01/07/2000    Migraine Headaches 05/13/2002           Past Surgical History:  Past Surgical History Pertinent Negatives:   Procedure Date Noted    CORONARY ARTERY ANGIOPLASTY 07/19/2007    HX ADENOIDECTOMY 07/19/2007    HX APPENDECTOMY 07/19/2007    HX CESAREAN SECTION 07/19/2007    HX CHOLECYSTECTOMY 07/19/2007    HX HERNIA REPAIR 07/19/2007    HX HYSTERECTOMY 07/19/2007    HX TONSILLECTOMY 07/19/2007           Social History:  Social History     Socioeconomic History    Marital status: Single     Spouse name: Not on file    Number of children: Not on file    Years of education: Not on file    Highest education level: Not on file   Occupational History    Not on file   Tobacco Use    Smoking status: Former Smoker     Packs/day: 0.50    Smokeless tobacco: Current User     Types: Snuff   Substance and Sexual Activity    Alcohol use: No    Drug use: Yes     Comment: marijuana    Sexual activity: Not on file  Other Topics Concern    Not on file   Social History Narrative    Not on file     Social Determinants of Health     Financial Resource Strain:     Difficulty of Paying Living Expenses:    Food Insecurity:     Worried About Programme researcher, broadcasting/film/video in the Last Year:     Barista in the Last Year:    Transportation Needs:     Freight forwarder (Medical):     Lack of Transportation (Non-Medical):    Physical Activity:     Days of Exercise per Week:     Minutes of Exercise per Session:    Stress:     Feeling of Stress :    Intimate Partner Violence:     Fear of Current or Ex-Partner:     Emotionally Abused:     Physically Abused:     Sexually Abused:        Family History:  Family Medical History:       Problem Relation (Age of Onset)    Cancer     Hypertension (High Blood Pressure)                 Physical Exam:  All nurse's notes reviewed.   Filed Vitals:    10/28/19 1129   BP: 133/86   Pulse: 63   Resp: 16   Temp: 36.3 C (97.3 F)   SpO2: 96%       Constitutional: NAD.  A&Ox3.   HENT:  Head: NC AT. Bilateral TM effusions.   Mouth/throat: Oropharynx is clear and moist.   Eyes: Conjunctivae without discharge. EOMI. PERRL.   Neck: Trachea midline.   Cardiovascular: RRR. No murmurs, rubs, or gallops. No lower extremity swelling. Intact distal pulses.  Pulmonary/chest: No respiratory distress. Breath sounds equal BL. No wheezes, rales, or chest wall tenderness.   Musculoskeletal: No obvious deformity, swelling, or tenderness.   Skin: Warm and dry. No rash, erythema, pallor, or cyanosis.   Psychiatric: Behavior is normal. Mood and affect are congruent.   Neurological: A&Ox3. CN 2-12 intact.     Focused medical screening exam performed in triage secondary to no beds being immediately available in the department. Appropriate orders placed.     Labs:  No results found for this or any previous visit (from the past 24 hour(s)).      Orders Placed This Encounter    COVID-19 SCREENING - Send-out Testing Curator)       Abnormal Lab Results:  Labs Reviewed - No data to display    EKG: None Indicated      Plan: Appropriate labs ordered. Medical records reviewed.       Therapy/Procedures/Course/MDM:  The pt remained vitally stable throughout his ED visit.   Remarkable lab results as above.   The pt was agreeable to hold on antibiotics as this is likely a virus.   Isolation guidelines reviewed.   The pt was tested for COVID-19.   The pt will be discharged home at this time. The pt has been advised to return to the ED with any new/worsening sx. The pt was given the opportunity to ask questions and agreed to the plan.         Consults:  None Indicated      Clinical Impression:   Encounter Diagnosis   Name Primary?    Viral URI Yes          Disposition:  Discharged  Following the above history, physical exam, and studies, the pt was deemed stable and suitable for discharge.   The pt has been advised to return to the ED with any new, concerning, or worsening sx and to follow up as directed.   The  pt verbalized understanding of all instructions and hand no further questions or concerns.       Follow Up:  [facility] - Emergency Department  [address]  [place] [address]  [phone]    As needed, If symptoms worsen        I am scribing for, and in the presence of, Rob Hickman, APRN, NP-C, for services provided on 10/28/2019.   //Alexee Mongold, SCRIBE,  10/28/2019 12:50 PM    The co-signing faculty was physically present in ED and available for consultation and did not participate in the care of this patient.    I personally performed the services described in this documentation, as scribed  in my presence, and it is both accurate  and complete.    Rob Hickman, APRN,NP-C  Rob Hickman, APRN,NP-C  10/30/2019, 13:11

## 2019-10-28 NOTE — ED Nurses Note (Signed)
Pt seen and d/c by provider

## 2020-01-14 ENCOUNTER — Other Ambulatory Visit: Payer: Self-pay

## 2020-01-14 ENCOUNTER — Ambulatory Visit (INDEPENDENT_AMBULATORY_CARE_PROVIDER_SITE_OTHER): Payer: Self-pay

## 2020-01-14 ENCOUNTER — Emergency Department (HOSPITAL_COMMUNITY): Payer: Self-pay

## 2020-01-14 ENCOUNTER — Emergency Department
Admission: EM | Admit: 2020-01-14 | Discharge: 2020-01-14 | Disposition: A | Discharge status: Home / Self Care | Payer: Self-pay | Attending: Emergency Medicine | Admitting: Emergency Medicine

## 2020-01-14 DIAGNOSIS — F17200 Nicotine dependence, unspecified, uncomplicated: Secondary | ICD-10-CM | POA: Insufficient documentation

## 2020-01-14 DIAGNOSIS — Z20822 Contact with and (suspected) exposure to covid-19: Secondary | ICD-10-CM | POA: Insufficient documentation

## 2020-01-14 DIAGNOSIS — Z8616 Personal history of COVID-19: Secondary | ICD-10-CM | POA: Insufficient documentation

## 2020-01-14 DIAGNOSIS — J069 Acute upper respiratory infection, unspecified: Secondary | ICD-10-CM | POA: Insufficient documentation

## 2020-01-14 LAB — COVID-19, FLU A/B, RSV RAPID BY PCR
INFLUENZA VIRUS TYPE A: NOT DETECTED
INFLUENZA VIRUS TYPE B: NOT DETECTED
RESPIRATORY SYNCTIAL VIRUS (RSV): NOT DETECTED
SARS-CoV-2: NOT DETECTED

## 2020-01-14 NOTE — ED Nurses Note (Signed)
Pt arrives to the ED c/o congestion, slight cough, and HA x3 days. Pt reports unable to go to work since onset of symptoms. Pt in no distress on arrival. Pt in no respiratory distress.

## 2020-01-14 NOTE — ED Attending Note (Signed)
ED ATTENDING NOTE:    I was physically present and directly supervised this patients care. Patient seen and examined with the resident/MLP, Dr. Janey Greaser, and history and exam reviewed. Key elements in addition to and/or correction of that documentation are as follows:    HPI:  Terrance Irwin is a 29 y.o. male who presents with complaint of sinus congestion and HA.  Mild dry cough.  Has been out of work since Sunday.    Further historical details can be found in the resident/MLP note.    PERTINENT PHYSICAL EXAM:  ED Triage Vitals [01/14/20 1345]   BP (Non-Invasive) (!) 146/84   Heart Rate 80   Respiratory Rate 20   Temperature 36.3 C (97.3 F)   SpO2 97 %   Weight 108 kg (237 lb 3.4 oz)   Height 1.727 m (5\' 8" )         I have seen and physically examined the patient.  I agree with the physical exam as documented in Dr. note.    LABS: Reviewed    IMAGING: Reviewed    ECG: Reviewed      ED COURSE:  Patient remained stable while in the ED.      Patient presented with cough, congestion, headache.  Concern for COVID-19 versus other viral URI versus flu versus RSV versus pneumonia.  Well-appearing on exam.    COVID, flu, RSV swab was negative.  Provided with work note.  Remained hemodynamically stable while in the emergency department.  Well-appearing.  No labs or chest x-ray were done given the patient was well appearing.    Patient vitally stable, safe for discharge home  All questions answered, return precautions discussed  Patient discharged home      DISPO: Discharged      CLINICAL IMPRESSION:   Encounter Diagnosis   Name Primary?    Upper respiratory infection, viral Yes         Bascom Levels, MD  01/14/2020, 14:18

## 2020-01-14 NOTE — ED Provider Notes (Signed)
[facility] - Emergency Department  Provider Note    Name: [facility]ance [facility]  Age and Gender: 29 y.o. male  Date of Birth: 29-Mar-1990  Date of Service: 01/14/2020   MRN: [id number]  PCP: No Pcp    Chief Complaint   Patient presents with    Congestion     A little bit of a cough, sinus congestion, headache X a few days. He reports he has been out of work since Sunday due to feeling sick. He reports he had COVID-19 in February.       HPI:  Arrival: The patient arrived by private car and is alone  History Limitations: none    [facility] is a 29 y.o. male presenting with "head cold".    Patient has a three day history of headache, sinus pressure, runny nose, and dry cough. He states he's had COVID in February 2021, and this feels similar. He is unvaccinated. He denies fevers, CP, SOB, or abdominal symptoms.    ROS:  Constitutional: No fever, chills or weakness   Skin: No rash or diaphoresis  Eyes: No vision changes or photophobia   Cardio: No chest pain, palpitations or leg swelling   Respiratory: No wheezing or SOB  GI:  No nausea, vomiting or stool changes    All other systems reviewed and are negative.  Review of Systems        Below pertinent information reviewed with patient and/or EMR:  Past Medical History:   Diagnosis Date    ADHD (attention deficit hyperactivity disorder)     Headaches 01/07/2000    Migraine Headaches 05/13/2002     Medications Prior to Admission     Prescriptions    dextroamphetamine-amphetamine (ADDERALL) 10 mg Oral Tablet    Take 10 mg by mouth Once a day    sumatriptan succinate (IMITREX) 100 mg Oral Tablet    Take 100 mg by mouth Once, as needed for Migraine May repeat in 2 hours in needed        No Known Allergies  No past surgical history on file.  Family Medical History:     Problem Relation (Age of Onset)    Cancer     Hypertension (High Blood Pressure)         Social History     Tobacco Use    Smoking status: Former Smoker     Packs/day: 0.50    Smokeless  tobacco: Current User     Types: Snuff   Substance Use Topics    Alcohol use: No    Drug use: Yes     Comment: marijuana       Objective:  ED Triage Vitals [01/14/20 1345]   BP (Non-Invasive) (!) 146/84   Heart Rate 80   Respiratory Rate 20   Temperature 36.3 C (97.3 F)   SpO2 97 %   Weight 108 kg (237 lb 3.4 oz)   Height 1.727 m (5\' 8" )     Nursing notes and vital signs reviewed.    Constitutional:  29 y.o. male who appears stated age in good health and in mild distress. Normal color, no cyanosis.   HENT:   Head: Normocephalic and atraumatic.   Ears: TM's clear without bulging or erythema or effusion  Mouth/Throat: Oropharynx is clear and moist.   Eyes: EOMI, PERRL   Neck: Trachea midline. Neck supple.  Cardiovascular: RRR, No murmurs, rubs or gallops. Intact distal pulses.  Pulmonary/Chest: BS equal bilaterally. No respiratory distress. No  wheezes, rales or chest tenderness.   Abdominal: BS +. Abdomen soft, no tenderness, rebound or guarding.  Musculoskeletal: No edema, tenderness or deformity.  Skin: warm and dry. No rash, erythema, pallor or cyanosis  Psychiatric: normal mood and affect. Behavior is normal.   Labs:   Labs Reviewed   COVID-19, FLU A/B, RSV RAPID BY PCR - Normal    Narrative:     Results are for the simultaneous qualitative identification of SARS-CoV-2 (formerly 2019-nCoV), Influenza A, Influenza B, and RSV RNA. These etiologic agents are generally detectable in nasopharyngeal and nasal swabs during the ACUTE PHASE of infection. Hence, this test is intended to be performed on respiratory specimens collected from individuals with signs and symptoms of upper respiratory tract infection who meet Centers for Disease Control and Prevention (CDC) clinical and/or epidemiological criteria for Coronavirus Disease 2019 (COVID-19) testing. CDC COVID-19 criteria for testing on human specimens is available at [facility] webpage information for Healthcare Professionals: Coronavirus Disease 2019 (COVID-19)  ([email or web address]).     False-negative results may occur if the virus has genomic mutations, insertions, deletions, or rearrangements or if performed very early in the course of illness. Otherwise, negative results indicate virus specific RNA targets are not detected, however negative results do not preclude SARS-CoV-2 infection/COVID-19, Influenza, or Respiratory syncytial virus infection. Results should not be used as the sole basis for patient management decisions. Negative results must be combined with clinical observations, patient history, and epidemiological information. If upper respiratory tract infection is still suspected based on exposure history together with other clinical findings, re-testing should be considered.    Disclaimer:   This assay has been authorized by FDA under an Emergency Use Authorization for use in laboratories certified under the Clinical Laboratory Improvement Amendments of 1988 (CLIA), 42 U.S.C. [phone], to perform high complexity tests. The impacts of vaccines, antiviral therapeutics, antibiotics, chemotherapeutic or immunosuppressant drugs have not been evaluated.     Test methodology:   Cepheid Xpert Xpress SARS-CoV-2/Flu/RSV Assay real-time polymerase chain reaction (RT-PCR) test on the GeneXpert Dx and Xpert Xpress systems.       Imaging:  No orders to display       MDM/Course:  [facility] is a 30 y.o. male who presented with URTI.     Patient seen by and discussed with attending physician, Dr. Rhona Raider.   Relevant/pertinent previous medical records reviewed via chart review activity and/or CareEverywhere activity.   Patient voiced concern about COVID and wanted work note. He states he would decline [employer] if offered.   COVID, flu A/B, RSV ordered   Given well-appearance, stable vital signs, and short course most consistent with viral infection, will not obtain labs or CXR at this time   COVID, flu, RSV swab  negative   Findings discussed with patient who is amenable to home discharge with presumed diagnosis of viral upper respiratory tract infection.   Patient given return precautions and discharged without further questions         Clinical Impression:     Encounter Diagnosis   Name Primary?    Upper respiratory infection, viral Yes       Medications given:  Medications - No data to display    Following the below history, physical exam, and studies, the patient was deemed stable and suitable for discharge. The patient was advised to return to the ED for any new or worsening symptoms. Discharge medications, and follow-up instructions were discussed with the patient in detail, who verbalizes understanding. The patient is in agreement  and is comfortable with the plan of care.     Disposition: Discharged       Current Discharge Medication List      CONTINUE these medications - NO CHANGES were made during your visit.      Details   dextroamphetamine-amphetamine 10 mg Tablet  Commonly known as: ADDERALL   10 mg, Oral, DAILY  Refills: 0     Imitrex 100 mg Tablet  Generic drug: sumatriptan succinate   100 mg, Oral, ONCE PRN, May repeat in 2 hours in needed   Refills: 0            Parts of this patients chart were completed in a retrospective fashion due to simultaneous direct patient care activities in the Emergency Department.   This note was partially generated using MModal Fluency Direct system, and there may be some incorrect words, spellings, and punctuation that were not noted in checking the note before saving.      Truddie Coco, MD 01/14/2020, 16:41   PGY 1 - Emergency Medicine  [facility]

## 2020-01-14 NOTE — ED Nurses Note (Addendum)
Pt provided AVS and education. Pt denies needs or concerns. Pt ambulatory from ED at this time.

## 2020-01-14 NOTE — Discharge Instructions (Addendum)
You were seen in the ER for cough and congestion. You tested negative for COVID-19. You likely have a viral infection that should clear up in a few days. Please return to the ER if symptoms last longer than a week or you experience any new or worsening symptoms.

## 2020-04-01 ENCOUNTER — Other Ambulatory Visit: Payer: Self-pay

## 2020-04-01 ENCOUNTER — Emergency Department
Admission: EM | Admit: 2020-04-01 | Discharge: 2020-04-01 | Disposition: A | Discharge status: Home / Self Care | Payer: Self-pay | Attending: Emergency Medicine | Admitting: Emergency Medicine

## 2020-04-01 DIAGNOSIS — Z20822 Contact with and (suspected) exposure to covid-19: Secondary | ICD-10-CM | POA: Insufficient documentation

## 2020-04-01 DIAGNOSIS — J069 Acute upper respiratory infection, unspecified: Secondary | ICD-10-CM | POA: Insufficient documentation

## 2020-04-01 DIAGNOSIS — F17200 Nicotine dependence, unspecified, uncomplicated: Secondary | ICD-10-CM | POA: Insufficient documentation

## 2020-04-01 LAB — COVID-19 ~~LOC~~ MOLECULAR LAB TESTING
INFLUENZA VIRUS TYPE A: NOT DETECTED
INFLUENZA VIRUS TYPE B: NOT DETECTED
RESPIRATORY SYNCTIAL VIRUS (RSV): NOT DETECTED
SARS-CoV-2: NOT DETECTED

## 2020-04-01 NOTE — ED Nurses Note (Signed)
Patient care transferred; report received from Windell Moulding RN. Patient awaiting results for further disposition.

## 2020-04-01 NOTE — ED Nurses Note (Signed)
Patient discharge education and information given; no questions or concerns at this moment.

## 2020-04-01 NOTE — Discharge Instructions (Signed)
Your seen in the emergency department because you were having upper respiratory symptoms concerning for COVID.  We tested you for COVID you were negative, and because your symptoms are mild to moderate you are safe for discharge home.  You may use any over-the-counter drugs to alleviate your symptoms.  If you have any worsening of your symptoms or any concerning symptoms or if your symptoms worsen, please see your primary care physician or return to the emergency department.

## 2020-04-01 NOTE — ED Provider Notes (Signed)
[facility] Department   Provider Note    Name: [facility]ance [facility]  Age and Gender: 30 y.o. male  Date of Birth: 27-Jul-1990  Date of Service: 04/01/2020   MRN: [id number]  PCP: No Pcp    Chief Complaint   Patient presents with    Exposure To Coronavirus     Feels "sick and swollen." Has headache. Took 3 Excedrin and it "did nothing." Fever, chills, nausea, possible exposure to Covid.        HPI:  Arrival: The patient arrived by private car and is alone  History Limitations: none    [facility] is a 30 y.o. male w/ no significant PMH presenting with 3 days of nausea, vomiting, and URI symptoms. He states that he "partied a lot" 4 days ago and woke up 3 days ago, and thought he was hungover because he was nauseous and vomiting. He then developed a subjective fever, congestion, headache, and swollen eyes. He has been able to tolerate PO and is drinking plenty of fluids. 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 He has not been knowingly exposed to a COVID-19 positive person. He denies chest pain or shortness of breath. He is here today because he would like to get a COVID test. He is unvaccinated and has had COVID in the past.     ROS:  Constitutional: +subjective fevers, malaise. No weakness.   Skin: +diaphoresis No rashes.  HENT: +congestion, postnasal drip, and headache.  Eyes: No vision changes or photophobia   Cardio: No chest pain, palpitations or leg swelling   Respiratory: No cough, wheezing or SOB  GI:  +nausea/vomiting. No stool changes.  GU:  No dysuria, hematuria, or increased frequency  MSK: No muscle aches, joint or back pain  Neuro: No seizures, LOC, numbness, tingling, or focal weakness  Psychiatric: No depression, SI or substance abuse  All other systems reviewed and are negative.      Below pertinent information reviewed with patient and/or EMR:  Past Medical History:    Diagnosis Date    ADHD (attention deficit hyperactivity disorder)     Headaches 01/07/2000    Migraine Headaches 05/13/2002     Medications Prior to Admission       Prescriptions    dextroamphetamine-amphetamine (ADDERALL) 10 mg Oral Tablet    Take 10 mg by mouth Once a day    sumatriptan succinate (IMITREX) 100 mg Oral Tablet    Take 100 mg by mouth Once, as needed for Migraine May repeat in 2 hours in needed          No Known Allergies  No past surgical history on file.  Family Medical History:       Problem Relation (Age of Onset)    Cancer     Hypertension (High Blood Pressure)           Social History     Tobacco Use    Smoking status: Former Smoker     Packs/day: 0.50    Smokeless tobacco: Current User     Types: Snuff   Substance Use Topics    Alcohol use: No    Drug use: Yes     Comment: marijuana       Objective:  ED Triage Vitals [04/01/20 1807]   BP (Non-Invasive) (!) 143/88   Heart Rate 84   Respiratory Rate 18  Temperature 36.4 C (97.5 F)   SpO2 98 %   Weight 110 kg (241 lb 13.5 oz)   Height 1.727 m (5\' 8" )     Nursing notes and vital signs reviewed.    Constitutional:  30 y.o. male who appears stated age in good health and in no distress. Normal color, no cyanosis.   HENT:   Head: Normocephalic and atraumatic.   Mouth/Throat/Nose: Rhinorrhea noted. Oropharynx is clear and moist.   Eyes: EOMI, PERRL   Neck: Trachea midline. Neck supple.  Cardiovascular: RRR, No murmurs, rubs or gallops. Intact distal pulses.  Pulmonary/Chest: BS equal bilaterally. No respiratory distress. No wheezes, rales or chest tenderness.   Abdominal: BS +. Abdomen soft, no tenderness, rebound or guarding.    Musculoskeletal: No edema, tenderness or deformity.  Skin: warm and dry. No rash, erythema, pallor or cyanosis  Psychiatric: normal mood and affect. Behavior is normal.   Neurological: Patient keenly alert and responsive, moving all extremities equally and fully.    Labs:   Labs Reviewed   COVID-19 [place]  TESTING - Normal    Narrative:     Results are for the simultaneous qualitative identification of SARS-CoV-2 (formerly 2019-nCoV), Influenza A, Influenza B, and RSV RNA. These etiologic agents are generally detectable in nasopharyngeal and nasal swabs during the ACUTE PHASE of infection. Hence, this test is intended to be performed on respiratory specimens collected from individuals with signs and symptoms of upper respiratory tract infection who meet Centers for Disease Control and Prevention (CDC) clinical and/or epidemiological criteria for Coronavirus Disease 2019 (COVID-19) testing. CDC COVID-19 criteria for testing on human specimens is available at [facility] webpage information for Healthcare Professionals: Coronavirus Disease 2019 (COVID-19) ([facility]).     False-negative results may occur if the virus has genomic mutations, insertions, deletions, or rearrangements or if performed very early in the course of illness. Otherwise, negative results indicate virus specific RNA targets are not detected, however negative results do not preclude SARS-CoV-2 infection/COVID-19, Influenza, or Respiratory syncytial virus infection. Results should not be used as the sole basis for patient management decisions. Negative results must be combined with clinical observations, patient history, and epidemiological information. If upper respiratory tract infection is still suspected based on exposure history together with other clinical findings, re-testing should be considered.    Disclaimer:   This assay has been authorized by FDA under an Emergency Use Authorization for use in laboratories certified under the Clinical Laboratory Improvement Amendments of 1988 (CLIA), 42 U.S.C. [id number], to perform high complexity tests. The impacts of vaccines, antiviral therapeutics, antibiotics, chemotherapeutic or immunosuppressant drugs have not been evaluated.     Test methodology:   Cepheid Xpert  Xpress SARS-CoV-2/Flu/RSV Assay real-time polymerase chain reaction (RT-PCR) test on the GeneXpert Dx and Xpert Xpress systems.       Imaging:  No orders to display       MDM/Course:  Natnael Biederman is a 30 y.o. male who presented with 3 days of URI symptoms, requesting a COVID test and declining any further workup or treatment at this time.    Patient seen by and discussed with attending physician, Dr. 37.  Relevant/pertinent previous medical records reviewed via chart review activity and/or CareEverywhere activity.    ED Course as of 04/02/20 1347   Wed Apr 01, 2020   1842 No further blood testing indicated at this time. Patient also declines any further testing, would just like a COVID test. Patient currently satting 98% on RA, with no  increased WOB. Not in any distress. States that discomfort is isolated to upper respiratory area. No sensation of SOB or chest pain. At this time, no clinical indication for EKG or CXR. If COVID+, mild. Rapid COVID ordered. [FB]   2010 COVID-19 SCREENING FOR ANTIBODY THERAPY  COVID-19 negative. Patient ready for discharge. No concerns at this time for more insidious etiology. [FB]      ED Course User Index  [FB] Dedra Skeens, MD       Clinical Impression:     Encounter Diagnosis   Name Primary?    Upper respiratory tract infection, unspecified type Yes       Medications given:  None    Following the below history, physical exam, and studies, the patient was deemed stable and suitable for discharge. The patient was advised to return to the ED for any new or worsening symptoms. Discharge medications, and follow-up instructions were discussed with the patient in detail, who verbalizes understanding. The patient is in agreement and is comfortable with the plan of care.     Disposition: Discharged       Current Discharge Medication List        CONTINUE these medications - NO CHANGES were made during your visit.        Details   dextroamphetamine-amphetamine 10 mg  Tablet  Commonly known as: ADDERALL   10 mg, Oral, DAILY  Refills: 0     Imitrex 100 mg Tablet  Generic drug: sumatriptan succinate   100 mg, Oral, ONCE PRN, May repeat in 2 hours in needed   Refills: 0                Parts of this patients chart were completed in a retrospective fashion due to simultaneous direct patient care activities in the Emergency Department.   This note was partially generated using MModal Fluency Direct [facility], and there may be some incorrect words, spellings, and punctuation that were not noted in checking the note before saving.      Abran Duke "Ranae Palms, MD 04/02/2020, 13:47   PGY-1 Emergency Medicine  [facility]  Pager # - [facility]

## 2020-04-01 NOTE — Nurses Notes (Signed)
VSS. Covid swab sent.

## 2020-04-01 NOTE — ED Attending Note (Signed)
04/01/2020      I was physically present and directly supervised this patient's care. Patient was seen and examined. The midlevel's/resident's history and exam and note were reviewed. I have discussed the case with the resident/mid level provider. I have personally performed a history, physical exam, and my own medical decision making. Key elements in addition to and/or correction of that documentation are as follows:    Chief Complaint   Patient presents with    Exposure To Coronavirus     Feels "sick and swollen." Has headache. Took 3 Excedrin and it "did nothing." Fever, chills, nausea, possible exposure to Covid.        [facility] is a 30 y.o. male p/w congestion  Subjective fevers  covid exposure  Headache  Wants covid test  Not vaccinated to covid     Further historical details can be found in the resident/MLP note.    Pertinent past medical, past surgical, family, social histories as well as home medications and allergies were reviewed with patient and/or EMR, see resident note/EMR for full details.    Pertinent Exam  Filed Vitals:    04/01/20 1807   BP: (!) 143/88   Pulse: 84   Resp: 18   Temp: 36.4 C (97.5 F)   SpO2: 98%     Agree w resident    Course  Bedside Ultrasound none    Results reviewed.   EKG: Reviewed, see CVIS tests for interpretation.    Intervention:  Pt does not want treatment  Will covid test    Clinical Impressions: agree w resident  Encounter Diagnosis   Name Primary?    Upper respiratory tract infection, unspecified type Yes       Dispo:   D/c home      Additional verbal discharge instructions were given and discussed with the patient    Galen Daft, MD 04/01/2020, 18:12  Emergency Medicine Attending    Management and treatment decisions made amidst COVID-19 public health emergency; admission vs. discharge standard has necessarily shifted.

## 2020-07-24 ENCOUNTER — Encounter (HOSPITAL_COMMUNITY): Payer: Self-pay

## 2020-07-24 ENCOUNTER — Emergency Department
Admission: EM | Admit: 2020-07-24 | Discharge: 2020-07-24 | Disposition: A | Discharge status: Home / Self Care | Payer: Self-pay | Attending: Physician Assistant | Admitting: Physician Assistant

## 2020-07-24 ENCOUNTER — Other Ambulatory Visit: Payer: Self-pay

## 2020-07-24 DIAGNOSIS — L03317 Cellulitis of buttock: Secondary | ICD-10-CM | POA: Insufficient documentation

## 2020-07-24 DIAGNOSIS — Z87891 Personal history of nicotine dependence: Secondary | ICD-10-CM | POA: Insufficient documentation

## 2020-07-24 DIAGNOSIS — L0231 Cutaneous abscess of buttock: Secondary | ICD-10-CM | POA: Insufficient documentation

## 2020-07-24 LAB — CBC WITH DIFF
BASOPHIL #: <0.1 10*3/uL (ref <=0.20)
BASOPHIL %: 0 %
EOSINOPHIL #: 0.12 10*3/uL (ref <=0.50)
EOSINOPHIL %: 1 %
HCT: 43.3 % (ref 38.9–52.0)
HGB: 14.8 g/dL (ref 13.4–17.5)
IMMATURE GRANULOCYTE #: <0.1 10*3/uL (ref <0.10)
IMMATURE GRANULOCYTE %: 0 % (ref 0–1)
LYMPHOCYTE #: 2.9 10*3/uL (ref 1.00–4.80)
LYMPHOCYTE %: 30 %
MCH: 28.1 pg (ref 26.0–32.0)
MCHC: 34.2 g/dL (ref 31.0–35.5)
MCV: 82.3 fL (ref 78.0–100.0)
MONOCYTE #: 0.76 10*3/uL (ref 0.20–1.10)
MONOCYTE %: 8 %
MPV: 10.3 fL (ref 8.7–12.5)
NEUTROPHIL #: 5.97 10*3/uL (ref 1.50–7.70)
NEUTROPHIL %: 61 %
PLATELETS: 223 10*3/uL (ref 150–400)
RBC: 5.26 10*6/uL (ref 4.50–6.10)
RDW-CV: 12.3 % (ref 11.5–15.5)
WBC: 9.8 10*3/uL (ref 3.7–11.0)

## 2020-07-24 LAB — BASIC METABOLIC PANEL
ANION GAP: 8 mmol/L (ref 4–13)
BUN/CREA RATIO: 15 (ref 6–22)
BUN: 12 mg/dL (ref 8–25)
CALCIUM: 9.3 mg/dL (ref 8.5–10.0)
CHLORIDE: 106 mmol/L (ref 96–111)
CO2 TOTAL: 26 mmol/L (ref 22–30)
CREATININE: 0.79 mg/dL (ref 0.75–1.35)
ESTIMATED GFR: >90 mL/min/BSA (ref >=60)
GLUCOSE: 101 mg/dL (ref 65–125)
POTASSIUM: 4 mmol/L (ref 3.5–5.1)
SODIUM: 140 mmol/L (ref 136–145)

## 2020-07-24 LAB — HEPATITIS C ANTIBODY SCREEN WITH REFLEX TO HCV PCR: HCV ANTIBODY QUALITATIVE: NEGATIVE

## 2020-07-24 LAB — SEDIMENTATION RATE: ERYTHROCYTE SEDIMENTATION RATE (ESR): 5 mm/hr (ref 0–15)

## 2020-07-24 LAB — C-REACTIVE PROTEIN(CRP),INFLAMMATION: CRP INFLAMMATION: 7.3 mg/L (ref <8.0)

## 2020-07-24 LAB — HIV1/HIV2 SCREEN, COMBINED ANTIGEN AND ANTIBODY: HIV SCREEN, COMBINED ANTIGEN & ANTIBODY: NEGATIVE

## 2020-07-24 MED ORDER — SULFAMETHOXAZOLE 800 MG-TRIMETHOPRIM 160 MG TABLET
1.00 | ORAL_TABLET | Freq: Two times a day (BID) | ORAL | 0 refills | Status: AC
Start: 2020-07-24 — End: 2020-07-31

## 2020-07-24 NOTE — ED Triage Notes (Signed)
[facility] - Emergency Department   Provider in Triage Note     Patient Name: [facility]  Patient MRN: [id number]  Date and Time of Assessment: 07/24/2020 11:22     Chief Complaint   Patient presents with   . Abscess     Reports quarter sized abscess on L buttock, denies fevers chills, hx of abscess in the same place that resolved on its own        Vitals Recorded in This Encounter       07/24/2020  0958             BP: 139/80    Pulse: 82    Resp: 16    Temp: 36.6 C (97.9 F)    SpO2: 97 %            Brief HPI and Plan: sore spot to buttocks needs work excuse and possible antibiotics.     I personally performed the services described in this documentation, as scribed  in my presence, and it is both accurate  and complete.    Roselind Messier, PA-C

## 2020-07-24 NOTE — ED Nurses Note (Signed)
Pt given script, discharge instructions, and a work order by ED provider in 3T. Pt PIV removed. Pt declines further concerns/needs/questions at this time. Pt ambulated out of the ED.

## 2020-07-24 NOTE — ED Provider Notes (Signed)
Department of Emergency Medicine      Attending Physician: Dr. Caesar Bookman  ED Provider: Aggie Hacker PA-C  PCP: No Pcp None  UL:[id number]      HPI:  Terrance Irwin is a 30 y.o. male who presents to the ED via personal vehicle with complaints of what he thinks is an abscess on his left buttocks that has been present for the past 3 days. He states he has abscesses in this spot commonly but they normally resolve on their own. He states that when he woke up to go to work he was too sore so he came here. He denies fevers or any signs of illness. No significant PMH. NKDA.      Review of Systems     Constitutional: No fever, chills  Skin: No rash, +abscess L buttocks  HENT: No URI symptoms  Cardio: No chest pain or sob  Respiratory: No cough or SOB  GI:  No nausea, vomiting  GU:  No dysuria            History:   PMH:    Past Medical History:   Diagnosis Date    ADHD (attention deficit hyperactivity disorder)     Headaches 01/07/2000    Migraine Headaches 05/13/2002           PSH:  Past Surgical History was reviewed and is negative for sig hx      Social Hx:    Social History     Socioeconomic History    Marital status: Single     Spouse name: Not on file    Number of children: Not on file    Years of education: Not on file    Highest education level: Not on file   Occupational History    Not on file   Tobacco Use    Smoking status: Former Smoker     Packs/day: 0.50    Smokeless tobacco: Current User     Types: Snuff   Vaping Use    Vaping Use: Some days   Substance and Sexual Activity    Alcohol use: Yes     Comment: 12 pack/day    Drug use: Not Currently     Comment: marijuana    Sexual activity: Not Currently   Other Topics Concern    Not on file   Social History Narrative    Not on file     Social Determinants of Health     Financial Resource Strain: Not on file   Food Insecurity: Not on file   Transportation Needs: Not on file   Physical Activity: Not on file   Stress: Not on file   Intimate Partner  Violence: Not on file   Housing Stability: Not on file     Family Hx:   Family History   Problem Relation Age of Onset    Hypertension (High Blood Pressure) Unknown     Cancer Unknown      Allergies: No Known Allergies        Physical Exam:   Vitals reviewed.     Vitals:    07/24/20 0958   BP: 139/80   Pulse: 82   Resp: 16   Temp: 36.6 C (97.9 F)   SpO2: 97%   Weight: 112 kg (245 lb 13 oz)         Constitutional: Pt is alert and oriented and appears well-developed and well-nourished. No acute distress.  Eyes: Conjunctivae not injected. Pupils are equal, round.  Pulmonary/Chest:  No respiratory distress.  Skin: At the left buttocks near gluteal cleft there is a 2 cm area of induration with mild erythema. No active drainage or fluctuance. No perianal involvement. Likely no fluid collection that is able to be drained.  Neurological: Pt is alert and oriented.     Course:   Prescribed patient Bactrim       Removed patient's IV.             Encounter Diagnosis   Name Primary?    Cellulitis and abscess of buttock Yes    . The following are differentials that were considered while in the ED. This list is not all-inclusive.    Chief Complaint   Patient presents with    Abscess     Reports quarter sized abscess on L buttock, denies fevers chills, hx of abscess in the same place that resolved on its own     :       Plan:Will obtain the following labs/imaging and give pt the following medications to alleviate symptoms:  Orders Placed This Encounter    CBC/DIFF    BASIC METABOLIC PANEL    SEDIMENTATION RATE    C-REACTIVE PROTEIN(CRP),INFLAMMATION    HEPATITIS C ANTIBODY SCREEN WITH REFLEX TO HCV PCR    HIV1/HIV2 SCREEN, COMBINED ANTIGEN AND ANTIBODY    CBC WITH DIFF    trimethoprim-sulfamethoxazole (BACTRIM DS) 160-800mg  per tablet       Labs Reviewed   BASIC METABOLIC PANEL - Normal   SEDIMENTATION RATE - Normal   C-REACTIVE PROTEIN(CRP),INFLAMMATION - Normal   HEPATITIS C ANTIBODY SCREEN WITH REFLEX TO HCV PCR - Normal    HIV1/HIV2 SCREEN, COMBINED ANTIGEN AND ANTIBODY - Normal   CBC/DIFF    Narrative:     The following orders were created for panel order CBC/DIFF.  Procedure                               Abnormality         Status                     ---------                               -----------         ------                     CBC WITH DIFF[[id number]]                                    Final result                 Please view results for these tests on the individual orders.   CBC WITH DIFF     All labs were reviewed. Medical Records reviewed.     Radiographical Imaging:              Disposition: Discharged    Follow Up: [facility] - Emergency Department  [address]  [place] [address]  [phone]    As needed, If symptoms worsen    Clinical Impression:   Encounter Diagnosis   Name Primary?    Cellulitis and abscess of buttock Yes  Current Discharge Medication List        START taking these medications.        Details   sulfamethoxazole-trimethoprim 800-160 mg tablet  Commonly known as: BACTRIM DS   160 mg, Oral, 2 TIMES DAILY  Qty: 14 Tablet  Refills: 0            CONTINUE these medications - NO CHANGES were made during your visit.        Details   dextroamphetamine-amphetamine 10 mg Tablet  Commonly known as: ADDERALL   10 mg, Oral, DAILY  Refills: 0     Imitrex 100 mg Tablet  Generic drug: sumatriptan succinate   100 mg, Oral, ONCE PRN, May repeat in 2 hours in needed   Refills: 0              Future Appointments scheduled in [place]:  No future appointments.    Management decisions made amidst COVID-19 public health emergency; admission vs. discharge standard has necessarily shifted.     I am scribing for, and in the presence of, Aggie Hacker, [place], for services provided on 07/24/2020.  Carter Kitten, SCRIBE     The co-signing faculty was physically present in Emergency Department and available for consultation. He/she did not  particpate in the care of this  patient.  I personally performed the services described in this documentation, as scribed  in my presence, and it is both accurate  and complete.    Evlyn Courier [place], PA-C  07/24/2020, 16:32

## 2021-01-28 ENCOUNTER — Emergency Department (HOSPITAL_COMMUNITY): Payer: Self-pay

## 2021-01-28 ENCOUNTER — Other Ambulatory Visit: Payer: Self-pay

## 2021-01-28 ENCOUNTER — Emergency Department
Admission: EM | Admit: 2021-01-28 | Discharge: 2021-01-28 | Disposition: A | Discharge status: Home / Self Care | Payer: Self-pay | Attending: Emergency Medicine | Admitting: Emergency Medicine

## 2021-01-28 ENCOUNTER — Encounter (INDEPENDENT_AMBULATORY_CARE_PROVIDER_SITE_OTHER): Payer: Self-pay

## 2021-01-28 DIAGNOSIS — F17290 Nicotine dependence, other tobacco product, uncomplicated: Secondary | ICD-10-CM | POA: Insufficient documentation

## 2021-01-28 DIAGNOSIS — I10 Essential (primary) hypertension: Secondary | ICD-10-CM | POA: Insufficient documentation

## 2021-01-28 NOTE — ED Attending Note (Signed)
I was physically present and directly supervised this patients care. Patient seen and examined with the resident, Dr. Laurell Roof, and history and exam reviewed. Key elements in addition to and/or correction of that documentation are as follows:  Patient is a 30 y.o.  male presenting to the ED with chief complaint of asymptomatic hypertension.  30 year old male who was at a counseling appointment earlier today, states that he has had several significant stressors recently presents to the emergency department after he had a measured blood pressure of approximately 180 that counseling appointment.  Patient denies any visual changes, headache, confusion, chest pain, shortness of breath, abdominal pain, or other symptoms.      Patient is presenting with asymptomatic hypertension, and as per guidelines, there is no evidence suggesting that workup from a laboratory or imaging standpoint or EKG to evaluate for evidence of end-organ damage in an asymptomatic patient with only mildly elevated blood pressure would be beneficial, and as such, no workup was pursued.  Patient was referred to the Transitional Care Clinic to establish care with Internal Medicine to see if his blood pressure remains elevated on a repeat, separate measured and, if so, antihypertensives may be initiated at that time    ROS: Otherwise negative, if commented on in the HPI.   Filed Vitals:    01/28/21 1411 01/28/21 1509   BP: (!) 155/100 (!) 164/96   Pulse: 84 82   Resp: 16 17   Temp: 36.8 C (98.2 F)    SpO2: 99% 98%                Chart completed after conclusion of patient care due to time constraints of direct patient care during shift.  Chart was dictated using voice recognition software, which may lead to minor grammatical or syntax errors.

## 2021-01-28 NOTE — ED Nurses Note (Signed)
Verbal report received from prior RN. Care continued at this time by this RN.     K. Wiatt Mahabir, RN   01/28/21

## 2021-01-28 NOTE — ED Provider Notes (Signed)
[facility]  Resident Provider Note    Name: Terrance Irwin  Age and Gender: 30 y.o. male  PCP: No Pcp  Attending: Mare Ferrari, MD    Triage Note:   Hypertension (180/78 BP. No history of HTN. Denies HAs, dizziness, chest pain. Not on medications for BP)      Clinical Impression:     Encounter Diagnosis   Name Primary?    Hypertension, unspecified type Yes        Course and MDM:  Patient seen and examined. Labs and imaging reviewed.  Terrance Irwin is a 30 y.o. male presenting with concerns of hypertension at doctor's office to [id number] systolic  ED Course as of 01/28/21 1543   Thu Jan 28, 2021   1540 Upon arrival patient hypertensive [id number] systolic, otherwise hemodynamically stable, saturating 99% on room air, afebrile   1540 Physical exam unremarkable   1540 As patient's blood pressure in acceptable range the patient is otherwise asymptomatic, extensive discussion had with patient regarding the efficacy of starting a antihypertensive in the emergency department given patient's blood pressure and recent stressors.  Plan jointly made to discharge patient with transitional care follow-up       Regarding above, ED course entered by myself denoted by "SS."  Course entered by others, including prior to the initiation or following the conclusion of my care for the patient should be considered part of their MDM, as per their documentation, and was not necessarily used by myself.  Course above is in addition to, and/or in conjunction with, course as noted below in this section of this note.    Following the above history, physical exam, and studies, the patient was deemed stable and suitable for discharge. The patient was advised to return to the ED for any new or worsening symptoms. Discharge, medication and follow up instructions were discussed with the patient, who verbalized understanding. The patient is comfortable with the plan of care.    Disposition: Discharged    Follow up:    [facility]  [place] [id number]  [phone]    As needed, If symptoms worsen    Internal Medicine, [place]  [address] [phone]  [phone]        --------------------------------------------------------------------------------------------------------------------------------  HPI:  Terrance Irwin is a 30 y.o. male  who presents to the ED today for hypertension.   Patient reports he was at his PCP today where his BP was 180/78 and he was sent to the ED.    Patient denies any chest pain. He states recently he has been stressed and had a recent loss of a close friend.    He reports he has been traveling.    No other Sx were reported at this time.       History Limitations: None     Review of Systems:  ROS  Text in bold indicates (+) findings; all other findings (-)  Constitutional: fever, chills, weakness   Skin: rash, diaphoresis  HENT: headaches, congestion  Eyes: vision changes, photophobia   Cardio: chest pain, palpitations, leg swelling   Respiratory: cough, wheezing, SOB  GI:  nausea, vomiting, diarrhea, constipation, abdominal pain  GU:  dysuria, hematuria, increased frequency  MSK: muscle aches, joint, back pain  Neuro: seizures, confusion, LOC, numbness, tingling, focal weakness  Psychiatric: mood changes, anxiety  All other systems reviewed and are negative.  Below information obtained from chart review and reviewed with patient:  Past Medical History:   Diagnosis Date    ADHD (attention deficit hyperactivity disorder)     Headaches 01/07/2000    Migraine Headaches 05/13/2002         Current Outpatient Medications   Medication Sig    dextroamphetamine-amphetamine (ADDERALL) 10 mg Oral Tablet Take 10 mg by mouth Once a day    sumatriptan succinate (IMITREX) 100 mg Oral Tablet Take 100 mg by mouth Once, as needed for Migraine May repeat in 2 hours in needed     No  Known Allergies      Family History   Problem Relation Age of Onset    Hypertension (High Blood Pressure) Unknown     Cancer Unknown       No significant family hx other than noted above and no family hx of bleeding disorders or anesthesia problems.  Social History     Occupational History    Not on file   Tobacco Use    Smoking status: Former     Packs/day: 0.50     Types: Cigarettes    Smokeless tobacco: Current     Types: Snuff   Vaping Use    Vaping Use: Some days   Substance and Sexual Activity    Alcohol use: Yes     Comment: 12 pack/day    Drug use: Not Currently     Comment: marijuana    Sexual activity: Not Currently       Objective:  Nursing notes reviewed  ED Triage Vitals [01/28/21 1411]   BP (Non-Invasive) (!) 155/100   Heart Rate 84   Respiratory Rate 16   Temperature 36.8 C (98.2 F)   SpO2 99 %   Weight 115 kg (253 lb 1.4 oz)   Height 1.727 m (5\' 8" )     Filed Vitals:    01/28/21 1411 01/28/21 1509   BP: (!) 155/100 (!) 164/96   Pulse: 84 82   Resp: 16 17   Temp: 36.8 C (98.2 F)    SpO2: 99% 98%       Physical Exam  Physical Exam  Constitutional: Pleasant 30 y.o. male appears as stated age in good health, normal color, no cyanosis. Resting in bed in no acute distress  HENT:   Head: Normocephalic and atraumatic.   Mouth/Throat: Oropharynx is clear and moist.   Eyes: EOMI, PERRL , conjunctivae without discharge bilaterally  Neck: Trachea midline. Neck supple.  Cardiovascular: Regular rate and rhythm. No murmurs, rubs or gallops. Intact distal pulses.  Pulmonary/Chest: Breath sounds equal bilaterally. No respiratory distress. No gross wheezing appreciated or chest tenderness.   Abdominal: BS +. Abdomen soft, nontender, no rebound or guarding.  Back: No midline spinal tenderness, no CVA tenderness.           Musculoskeletal: No edema, tenderness or deformity.  Skin: Warm and dry. No rash, erythema, pallor or cyanosis  Psychiatric: Mood and affect congruent with clinical presentation.  Cooperative with interview and exam  Neurological: Patient alert and responsive, CN II-XII grossly intact, moving all extremities. Face symmetric     Labs:   Labs Reviewed - No data to display    Imaging:       EKG: If performed, reviewed with attending physician. See enterprise MUSE ECG's for full interpretation. Findings used by MDM my be noted in that section of this note.    Orders:  Orders Placed This Encounter    SCHEDULE FOLLOW-UP-[facility]-TRANSITION  OF CARE CLINIC-POC-[place], [place]     I am scribing for, and in the presence of, Leodis Liverpool, MD, for services provided on 01/28/2021.   Larkin [place] Pope,  01/28/2021 2:47 PM    I personally performed the services described in this documentation, as scribed  in my presence, and it is both accurate  and complete.    Leodis Liverpool, MD      Leodis Liverpool, MD 01/28/2021, 14:45   PGY-3 Emergency Medicine  [facility]  Pager # - [place] [facility]    *Parts of this patients chart were completed in a retrospective fashion due to simultaneous direct patient care activities in the Emergency Department.   *This note was partially generated using MModal Fluency Direct system, and there may be some incorrect words, spellings, and punctuation that were not noted in checking the note before saving.

## 2021-01-28 NOTE — ED Nurses Note (Signed)
PT expressed understanding of discharge instructions and provided verbal feedback of post ER care instructions including medications (new medications as well if applicable) and follow up appointments. PIV access is removed, bleeding controlled, tip intact, site dressed appropriately.  PT is stable for discharge at this time and all questions answered by MD and RN.     Louie Casa, RN   01/28/21

## 2021-01-28 NOTE — Nursing Note (Signed)
Soft admission order received and reviewed by Patient Navigator on 01/28/2021 for T[facility] - Transition Care Clinic. Crisof[facility] [place] for clinic sent? Yes. PCP Referral needed? No  Brennan Bailey, PATIENT NAVIGATOR 15:10   Patient Navigator- Population Health

## 2021-01-28 NOTE — Discharge Instructions (Signed)
We have placed an order to schedule you a close follow-up visit with our Transitions of Care Clinic (T[facility]). Someone should call you within the next 24 hours to schedule your appointment. We would like you to be seen in the Transitions of Care Clinic in 24-72 hours. Please check your [facility] MyChart for the most up-to-date time for your appointment schedule. If the time of the appointment is within 12 hours of your Emergency Department visit, please call the clinic number to have it rescheduled for 24-72 hours following your ED visit.     If your symptoms significantly worsen prior to your scheduled visit, please return to the Emergency Department for re-evaluation.

## 2021-01-28 NOTE — ED Nurses Note (Signed)
Pt 41M presents to ED30 1415 from triage after being referred from [facility] for high BP. No hx of hypertension, but undergoing loss and lots of stress, family hx. Only takes Adderall 10mg  5x/day at home. Asymptomatic, denies HA, dizziness, CP, or vision changes. Pt states he wouldn't have come here if he wasn't referred.    VSS, A/Ox4, no other complaints. Call bell within reach, will continue to monitor.    , RN  01/28/2021, 14:21

## 2021-01-29 ENCOUNTER — Telehealth (INDEPENDENT_AMBULATORY_CARE_PROVIDER_SITE_OTHER): Payer: Self-pay

## 2021-01-29 NOTE — Telephone Encounter (Signed)
Nurse Navigator Coordinator order received. Attempt number 1 made to contact patient.  Spoke with [facility] regarding follow-up needing scheduled with TCC.  Available dates and times obtained from patient. Patient agreess with appointment. Scheduled patient appointment for 1/5 at 9:45 AM.  Reviewed all information with patient including: (date, time, location, and directions for appointment).  Patient verbalized understanding.    Brennan Bailey, PATIENT NAVIGATOR, 01/29/2021, 13:34

## 2021-02-03 ENCOUNTER — Telehealth (INDEPENDENT_AMBULATORY_CARE_PROVIDER_SITE_OTHER): Payer: Self-pay | Admitting: Family

## 2021-02-03 NOTE — Telephone Encounter (Signed)
TCC appointment 1.5.23 not confirmed. Left vm wit appt details, location, and clinic number.    Herron Fero, CCMA  02/03/2021, 11:05

## 2021-02-04 ENCOUNTER — Encounter (INDEPENDENT_AMBULATORY_CARE_PROVIDER_SITE_OTHER): Payer: Self-pay | Admitting: Family

## 2021-02-14 ENCOUNTER — Emergency Department
Admission: EM | Admit: 2021-02-14 | Discharge: 2021-02-14 | Disposition: A | Discharge status: Home / Self Care | Payer: Self-pay | Attending: Medical | Admitting: Medical

## 2021-02-14 ENCOUNTER — Emergency Department (HOSPITAL_COMMUNITY): Payer: Self-pay

## 2021-02-14 ENCOUNTER — Encounter (HOSPITAL_COMMUNITY): Payer: Self-pay

## 2021-02-14 ENCOUNTER — Other Ambulatory Visit: Payer: Self-pay

## 2021-02-14 DIAGNOSIS — R0989 Other specified symptoms and signs involving the circulatory and respiratory systems: Secondary | ICD-10-CM | POA: Insufficient documentation

## 2021-02-14 DIAGNOSIS — R059 Cough, unspecified: Secondary | ICD-10-CM | POA: Insufficient documentation

## 2021-02-14 DIAGNOSIS — F909 Attention-deficit hyperactivity disorder, unspecified type: Secondary | ICD-10-CM | POA: Insufficient documentation

## 2021-02-14 DIAGNOSIS — Z20822 Contact with and (suspected) exposure to covid-19: Secondary | ICD-10-CM | POA: Insufficient documentation

## 2021-02-14 DIAGNOSIS — R61 Generalized hyperhidrosis: Secondary | ICD-10-CM | POA: Insufficient documentation

## 2021-02-14 DIAGNOSIS — J029 Acute pharyngitis, unspecified: Secondary | ICD-10-CM

## 2021-02-14 DIAGNOSIS — R519 Headache, unspecified: Secondary | ICD-10-CM | POA: Insufficient documentation

## 2021-02-14 LAB — STREPTOCOCCUS PYOGENES, PCR: STREPTOCOCCUS PYOGENES, PCR: NOT DETECTED

## 2021-02-14 LAB — COVID-19, FLU A/B, RSV RAPID BY PCR
INFLUENZA VIRUS TYPE A: NOT DETECTED
INFLUENZA VIRUS TYPE B: NOT DETECTED
RESPIRATORY SYNCTIAL VIRUS (RSV): NOT DETECTED
SARS-CoV-2: NOT DETECTED

## 2021-02-14 MED ORDER — MAGIC MOUTHWASH
10.00 mL | Freq: Four times a day (QID) | ORAL | 0 refills | Status: AC
Start: 2021-02-14 — End: ?

## 2021-02-14 NOTE — ED Nurses Note (Signed)
Sore throat, cough, and diaphoresis since last evening. Recent sick contacts from his wife who was sick all last week. Hx strep and states this feels similar.

## 2021-02-14 NOTE — ED Triage Notes (Signed)
[facility] - Emergency Department   Provider in Triage Note     Patient Name: Terrance Irwin  Patient MRN: [id number]  Date and Time of Assessment: 02/14/2021 13:44     Chief Complaint   Patient presents with   . Sore Throat     Sore throat, headache, runny nose that began last night, said tried taking sudafed without relief. Reporting chills.        Brief HPI:  Patient to emergency department for evaluation of a sore throat x 1 day.  He reports his wife is also sick.  He associates chills.    Physical Exam:   General: Alert, upright, no distress  Heart: S1S2 without murmur  Lungs: Lungs clear in all fields  Erythema to pharynx     Filed Vitals:    02/14/21 1340   BP: (!) 147/95   Pulse: (!) 119   Resp: 20   Temp: 36.3 C (97.3 F)   SpO2: 99%          Preliminary Plan:  Labs ordered  Patient will return to waiting room      I personally performed the services described in this documentation, as scribed  in my presence, and it is both accurate  and complete.    Michaelle Copas, APRN,NP-C      Michaelle Copas, APRN,NP-C  02/14/2021, 13:44

## 2021-02-14 NOTE — ED Nurses Note (Signed)
PT expressed understanding of discharge instructions and provided verbal feedback of post ER care instructions including medications (new medications as well if applicable) and follow up appointments. PIV access is removed, bleeding controlled, tip intact, site dressed appropriately.  PT is stable for discharge at this time and all questions answered by MD and RN.     K. Elaf Clauson, RN   02/14/21

## 2021-02-14 NOTE — ED Provider Notes (Signed)
[facility] - Emergency Department  ED Primary Provider Note  History of Present Illness   Chief Complaint   Patient presents with   . Sore Throat     Sore throat, headache, runny nose that began last night, said tried taking sudafed without relief. Reporting chills.      Terrance [facility]win is a 31 y.o. male who had concerns including Sore Throat.  Arrival: The patient arrived by Car and is alone    Yesterday, the patient reports he began experiencing a sore throat, that became so painful he was unable to sleep last night. This morning, he admits the pain has only worsened, promping his arrival to [facility] ED. Patient admits he has had strep throat in the past, and that this feels very similar. Additionally, he complains of diaphoresis and a cough. He did not take his temperature, so a fever is unknown, and he has no other concerns or complaints at this time. Of note, the patient's wife was sick last week, but self treated and her symptoms have since resolved. Per charting, PMH includes ADHD and migraines. Refer to charting for a list of medications that does not include any anticoagulants. NKDA.       Sore Throat    Review of Systems   Review of Systems  Historical Data   History Reviewed This Encounter: Medical History  Surgical History  Family History  Social History      Physical Exam   ED Triage Vitals [02/14/21 1340]   BP (Non-Invasive) (!) 147/95   Heart Rate (!) 119   Respiratory Rate 20   Temperature 36.3 C (97.3 F)   SpO2 99 %   Weight 114 kg (250 lb 10.6 oz)   Height 1.727 m (5\' 8" )     Physical Exam  Vitals and nursing note reviewed.   Constitutional:       Appearance: Normal appearance. He is normal weight.   HENT:      Head: Normocephalic and atraumatic.      Mouth/Throat:      Comments: Throat is red and tonsils are cryptic without exudate.   Eyes:      Extraocular Movements: Extraocular movements intact.      Conjunctiva/sclera: Conjunctivae normal.      Pupils: Pupils are equal,  round, and reactive to light.   Cardiovascular:      Rate and Rhythm: Normal rate and regular rhythm.      Pulses: Normal pulses.      Heart sounds: Normal heart sounds.   Pulmonary:      Effort: Pulmonary effort is normal.      Breath sounds: Normal breath sounds.   Abdominal:      General: Abdomen is flat.      Palpations: Abdomen is soft.   Musculoskeletal:         General: Normal range of motion.   Skin:     General: Skin is warm and dry.   Neurological:      General: No focal deficit present.      Mental Status: He is alert and oriented to person, place, and time.   Psychiatric:         Mood and Affect: Mood normal.         Behavior: Behavior normal.       Patient Data     Labs Reviewed   STREPTOCOCCUS PYOGENES, PCR - Normal   COVID-19, FLU A/B, RSV RAPID BY PCR     SARS-CoV-2  Not Detected Not Detected  Not Detected  Not Detected  Not Detected CM    INFLUENZA VIRUS TYPE A   Not Detected Not Detected  Not Detected  Not Detected     INFLUENZA VIRUS TYPE B   Not Detected Not Detected  Not Detected  Not Detected     RESPIRATORY SYNCTIAL VIRUS (RSV)   Not Detected Not Detected  Not Detected  Not       STREPTOCOCCUS PYOGENES, PCR   Target Not Detected Target Not Detected          Medical Decision Making     Medical Decision Making  Amount and/or Complexity of Data Reviewed  Labs: ordered.      Risk  OTC drugs.      Critical Care  Total time providing critical care: < 30 minutes    ED Course as of 02/14/21 1613   Sun Feb 14, 2021   1612 COVID-19, FLU A/B, RSV RAPID BY PCR            Following the history, physical exam, and ED workup, the patient was deemed stable and suitable for discharge. The patient/caregiver was advised to return to the ED for any new or worsening symptoms. Discharge medications, and follow-up instructions were discussed with the patient/caregiver in detail, who verbalizes understanding. The patient/caregiver is in agreement and is comfortable with the plan of care.    Disposition:  Discharged      Discharge Medication List as of 02/14/2021  3:33 PM      START taking these medications    Details   Magic Mouthwash Swish and Swallow 10 mL Four times a day Contains lidocaine viscous 2%, diphenhydramine 12.5 mg/5 ml, Maalox. Mix 1:1:1, Disp-180 mL, R-0, E-Rx           Follow up:   [facility] - Emergency Department  [address] [facility]  [place] [address]  [phone]    As needed, If symptoms worsen         I am scribing for, and in the presence of, Delsa Sale, [place], for services provided on 02/14/2021  Ellwood Handler, SCRIBE    // Ellwood Handler, SCRIBE  02/14/21 14:14    I personally performed the services described in this documentation, as scribed in my presence, and it is both accurate and complete.     The co-signing faculty was physically present and available for consultation and did not physically see this patient.  Delsa Sale, PA-C  02/14/2021, 16:15

## 2023-10-13 NOTE — Progress Notes (Signed)
 Procedure Details  D0140 - LIMITED ORAL EVALUATION - PROBLEM FOCUSED  LIMITED EXAM NOTE    Chief Condition: pain and/or swelling on tooth and gums     HPI:  Patient presents in pain on tooth UL       Past Medical History:   Diagnosis Date   . Depression      No past surgical history on file.  Social History     Tobacco Use   . Smoking status: Every Day     Types: E-Cigarettes   . Smokeless tobacco: Not on file   Substance Use Topics   . Alcohol  use: Not Currently     No family history on file.  Current Outpatient Medications on File Prior to Visit   Medication Sig Dispense Refill   . amphetamine -dextroamphetamine  (ADDERALL) 10 mg tablet Take 10 mg by mouth 1 (one) time each day.     . SUMAtriptan (IMITREX) 100 mg tablet Take 100 mg by mouth.       No current facility-administered medications on file prior to visit.       Radiographic Interpretation:   Associated radiographs for today's visit were reviewed and finding(s) were discussed with the patient.    Hard Tissue Exam:  Caries exam: Decay noted - see charting and treatment plan and Fractured restorations/teeth  Mobility: WNL    #15 has gross decay below gingiva tooth is not restorable needs EXT with BGM      Treatment Plan: Indication for tooth removal.     Orders Placed This Encounter   Procedures   . LIMITED ORAL EVALUATION - PROBLEM FOCUSED   . SINGLE X-RAY   . ADDITIONAL X-RAY   . BITEWING - SINGLE RADIOGRAPHIC IMAGE   . 15 EXTRACTION, ERUPTED TOOTH REQUIRING REMOVAL OF BONE AND/OR SECTIONING OF TOOTH   . 15 BONE REPLACEMENT GRAFT FOR RIDGE PRESERVATION - PER SITE - MAXILLARY   . 15 GUIDED TISSUE REGENERATION, EDENTULOUS AREA - RESORBABLE BARRIER, PER SITE   . PANORAMIC RADIOGRAPHIC IMAGE       15 [id number] - EXTRACTION, ERUPTED TOOTH REQUIRING REMOVAL OF BONE AND/OR SECTIONING OF TOOTH  Extraction Procedure Note    Past Medical History:   Diagnosis Date   . Depression      Medical History: Medical history reviewed, no changes since patient's last visit.      Extraction(s) for teeth:  15  Topical (Benzocaine 20%) placed.      Anesthetic: Septocaine (Articaine hydrochloride 4% w/ epi 1:100K) x Carpules: 2 carpules;    Surgical extraction of erupted tooth and manipulation of the mucoperiosteal tissue without flap elevated. Not sectioned.  Area debrided of any granulation tissue. Socket irrigated. Bone Removed:  Buccal. All indicated to remove tooth.     Sutures: One gut suture placed      Written & oral POI given along w/extra gauze.  15 D7953 - BONE REPLACEMENT GRAFT FOR RIDGE PRESERVATION - PER SITE - MAXILLARY    Bonegraft Procedure Note    Medical History:  Medical history reviewed, no changes since patient's last visit  bonegrafting of site No.(s) 15    The proposed treatment was reviewed and discussed with the patient including the risks, benefits, alternatives, risk of the alternatives. Risks were also discussed if no treatment was chosen. The patient agreed with the proposed treatment and informed consent was obtained. Specifically, we discussed that there is no guarantee that the bonegraft will take or succeed, and further grafting may be needed. The patient was given  a Peridex .12% rinse prior to the start of the procedure.    Vital Signs: BP;  See Anesthesia Record/Consultation note.    Anesthesia:  Local    Anesthetic: No Additional Anesthetic required  to the surgical site(s). Procedure: Throat Screen, Bite Block, TMJ stabilized. Crestal incision was made with sulcular extension to adjacent teeth with #15 blade. Full thickness flap was reflected to adequately access hard tissue. Socket was copiously irrigated and thoroughly currettaged.    Bonegraft:  Allograft block, Bonegraft Type:  [employer], Bonegraft Lot#: [id number]    Membrane:  Collagen, Membrane Lot#: [id number]    Closure:    4-0 chromic gut suture placed    Follow up:  As necessary    Post operative and follow up instructions reviewed with patient and/or escort where appropriate. Patient was  discharged in stable condition.          15 D7956 - GUIDED TISSUE REGENERATION, EDENTULOUS AREA - RESORBABLE BARRIER, PER SITE    Guided Tissue Regeneration Note      Collagen membrane used over graft site      D0220 - SINGLE X-RAY  D0230 - ADDITIONAL X-RAY  D0270 - BITEWING - SINGLE RADIOGRAPHIC IMAGE  D0330 - PANORAMIC RADIOGRAPHIC IMAGE

## 2023-11-01 ENCOUNTER — Ambulatory Visit (HOSPITAL_COMMUNITY): Payer: Self-pay

## 2023-11-01 NOTE — Telephone Encounter (Addendum)
 CM triaging with scheduling instructions and sending to Adult Med Management scheduling pool.       [facility] Scheduling options:    NPV-[1003] with:  -Resident, Subgroup: BEHAVIORAL MED RESIDENTS-[[id number]] @ [facility]  -PGY-1 Resident, Subgroup: [facility] PGY-1 PSYCHIATRY RESIDENTS-[[id number]] @ [facility]    *[facility] call center/call back number for scheduling- [phone]*  Corley. Corean, CASE MANAGER      Regarding: New Appointment  ----- Message from Bascom SQUIBB sent at 11/01/2023  8:24 AM EDT -----  Copied From CRM [id number].  T[facility]ance [facility] (Self) called to schedule an appointment.       Pt is wanting to schedule a NPV for psychiatry. Pt is looking to be prescribed Adderall. Thank you

## 2023-12-01 ENCOUNTER — Other Ambulatory Visit: Payer: Self-pay

## 2023-12-01 ENCOUNTER — Ambulatory Visit (HOSPITAL_COMMUNITY)

## 2023-12-01 ENCOUNTER — Ambulatory Visit (HOSPITAL_COMMUNITY): Payer: PRIVATE HEALTH INSURANCE | Admitting: PSYCHIATRY

## 2023-12-01 VITALS — BP 134/82 | HR 94 | Resp 18 | Ht 68.5 in | Wt 272.0 lb

## 2023-12-01 DIAGNOSIS — F410 Panic disorder [episodic paroxysmal anxiety] without agoraphobia: Secondary | ICD-10-CM

## 2023-12-01 DIAGNOSIS — F902 Attention-deficit hyperactivity disorder, combined type: Secondary | ICD-10-CM

## 2023-12-01 DIAGNOSIS — F439 Reaction to severe stress, unspecified: Secondary | ICD-10-CM

## 2023-12-01 DIAGNOSIS — F909 Attention-deficit hyperactivity disorder, unspecified type: Secondary | ICD-10-CM

## 2023-12-01 DIAGNOSIS — F17200 Nicotine dependence, unspecified, uncomplicated: Secondary | ICD-10-CM

## 2023-12-01 MED ORDER — LISDEXAMFETAMINE 30 MG CAPSULE: 30.0000 mg | ORAL_CAPSULE | Freq: Every day | ORAL | 0 refills | Status: DC

## 2023-12-01 MED ORDER — LISDEXAMFETAMINE 30 MG CAPSULE
30.0000 mg | ORAL_CAPSULE | Freq: Every day | ORAL | 0 refills | Status: AC
Start: 2023-12-01 — End: 2023-12-30

## 2023-12-01 MED ORDER — LISDEXAMFETAMINE 30 MG CAPSULE
30.0000 mg | ORAL_CAPSULE | Freq: Every day | ORAL | 0 refills | Status: AC
Start: 2023-12-31 — End: 2024-01-29

## 2023-12-01 NOTE — Addendum Note (Signed)
 Addended by: GIRARD MANN on: 12/01/2023 04:49 PM     Modules accepted: Level of Service
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
## 2023-12-01 NOTE — Nursing Note (Signed)
 12/01/23 1609   Drug Screen Results   Amphetamine  (AMP) Negative   Barbiturates (BAR) Negative   BUP - Cut off Levels 10 ng/ml Negative   Benzodiazepine (BZO) Negative   Cocaine  (COC) Negative   Methamphetamine (MET) Negative   Methadone (MTD) Negative   Morphine (MOP) Negative   Oxycodone (OXY) Negative   Marijuana (THC) Negative   Ecstasy (MDMA) Negative   Phencyclidine (PCP) Negative   Temperature within range? yes   Observed no   [employer]   Physician [place]   Lot # [id number]   Expiration Date 06/28/25   Internal Control Valid yes   Initials HR     Urine dumped per Dr. Darrel Katrinka Dross, Ambulatory Care Assistant        I saw and examined the patient on 12/01/2023 with resident physician Dr. Darrel. Please see full progress note on this date for additional details regarding treatment plan.    Urine drug screen was ordered & reviewed. Negative for all substances tested. I reviewed the Ambulatory Care Assistant's note. I agree with the findings and plan of care as documented.    Inge Bud, MD 12/01/2023, 16:40

## 2024-02-23 ENCOUNTER — Ambulatory Visit (INDEPENDENT_AMBULATORY_CARE_PROVIDER_SITE_OTHER): Payer: PRIVATE HEALTH INSURANCE | Admitting: PSYCHIATRY

## 2024-02-23 ENCOUNTER — Other Ambulatory Visit: Payer: Self-pay

## 2024-02-23 VITALS — BP 130/86 | HR 79 | Resp 18 | Ht 68.0 in | Wt 268.7 lb

## 2024-02-23 DIAGNOSIS — F410 Panic disorder [episodic paroxysmal anxiety] without agoraphobia: Secondary | ICD-10-CM

## 2024-02-23 DIAGNOSIS — F411 Generalized anxiety disorder: Secondary | ICD-10-CM

## 2024-02-23 DIAGNOSIS — F902 Attention-deficit hyperactivity disorder, combined type: Secondary | ICD-10-CM

## 2024-02-23 DIAGNOSIS — F439 Reaction to severe stress, unspecified: Secondary | ICD-10-CM

## 2024-02-23 MED ORDER — LISDEXAMFETAMINE 40 MG CAPSULE: 40.0000 mg | ORAL_CAPSULE | Freq: Every day | ORAL | 0 refills | Status: DC

## 2024-02-23 MED ORDER — LISDEXAMFETAMINE 40 MG CAPSULE: 40.0000 mg | ORAL_CAPSULE | Freq: Every day | ORAL | 0 refills | Status: AC

## 2024-02-23 NOTE — Progress Notes (Signed)
 [facility] Outpatient Psychiatry   Progress Note    Terrance Irwin  [id number]  Date of Service: 02/23/2024    CC:   Chief Complaint   Patient presents with    Medication follow up     History of Present Illness:    Terrance Irwin is a 34 y.o. male who presents to clinic for follow up, last seen 12/01/23.    Since last visit, the pt reports I like the Vyvanse, but I want to just go back to Adderall 40 mg. Mood is stable. Things have been very good for him. He has a newborn (3 day old) child so sleep has not been good, but he feels it is still stable overall. Denies any acute concerns. Reports he is hoping to continue cutting back on vaping, and would consider Wellbutrin again in the future.       Medical History  Primary Care Provider: No Pcp  Past Medical History:   Diagnosis Date    ADHD (attention deficit hyperactivity disorder)     Headaches 01/07/2000    Migraine Headaches 05/13/2002       Allergies: Allergies[1]  Medications:  Current Outpatient Medications   Medication Sig    dextroamphetamine -amphetamine  (ADDERALL) 10 mg Oral Tablet Take 10 mg by mouth Once a day    lisdexamfetamine  (VYVANSE) 40 mg Oral Capsule Take 1 Capsule (40 mg total) by mouth Daily    Magic Mouthwash Swish and Swallow 10 mL Four times a day Contains lidocaine viscous 2%, diphenhydramine  12.5 mg/5 ml, Maalox. Mix 1:1:1    sumatriptan succinate (IMITREX) 100 mg Oral Tablet Take 100 mg by mouth Once, as needed for Migraine May repeat in 2 hours in needed       Mental Status Exam:  Appearance: appears stated age, facial hair, and visible tattoos  Behavior: calm, cooperative, and good eye contact  Gait/Station: WNL  Musculoskeletal: No psychomotor agitation or retardation noted  Speech: regular rate and regular volume  Mood: awesome  Affect: full range  Thought Process: linear and goal-directed  Associations:  no loosening of associations  Thought Content: no thoughts of self-harm, no thoughts of suicide, and no  homicidal ideation  Perceptual Disturbances: no AVH  Attention/Concentration: distractible  Orientation: grossly oriented  Memory: recent and remote memory intact per interview  Language: no word-finding issues and no paraphasic errors  Insight: fair  Judgment: fair  Knowledge: consistent with education    ROS: Negative. Any positives noted in subjective.    Vitals:    02/23/24 0856   BP: 130/86   Pulse: 79   Resp: 18   SpO2: 98%   Weight: 122 kg (268 lb 11.9 oz)   Height: 1.727 m (5' 8)   BMI: 40.86         Pertinent Studies/Imaging:  Reviewed recent labs/imaging if available.     Board of Pharmacy:   Filled  Written  ID  Drug  QTY  Days  Prescriber  RX #  Dispenser  Refill  Daily Dose*  Pymt Type  PMP    01/27/2024 12/01/2023 1 Lisdexamfetamine  30 Mg Capsule 30.00 30 Ch Fly [id number] Wes (6306) 0  - [place]   12/28/2023 12/01/2023 1 Lisdexamfetamine  30 Mg Capsule 30.00 30 Ch Fly [id number] Wes (6306) 0  - [place]   12/01/2023 12/01/2023 1 Lisdexamfetamine  30 Mg Capsule 30.00 30 Ch Fly [id number] Wes (6306) 0  - [place]   04/05/2023 04/05/2023 1 Codeine-Guaifen 10-100 Mg/5 Ml 120.00 6 Everitt Everitt  [id number] Wal (1011) 0 6.00 MME - [place]   02/08/2023 12/27/2022 1 Dextroamp-Amphetamin 10 Mg Tab 60.00 30 Bo Bon [id number] Wal [phone]) 0  - [place]   02/08/2023 12/27/2022 1 Dextroamp-Amphet Er 30 Mg Cap 30.00 30 Bo [address] [id number] Wal [phone]) 0  - [place]   01/10/2023 12/27/2022 1 Dextroamp-Amphet Er 30 Mg Cap 30.00 30 Bo [address] [id number] Wal [phone]) 0  - [place]   01/07/2023 12/27/2022 1 Dextroamp-Amphetamin 10 Mg Tab 60.00 30 Bo [address] 7731569 Wal [phone]) 0  - [place]   12/02/2022 11/01/2022 1 Dextroamp-Amphet Er 30 Mg Cap 30.00 30 Bo [address] [id number] Wal [phone]) 0  - [place]   12/02/2022 11/01/2022 1 Dextroamp-Amphetamin 10 Mg Tab 60.00 30 Bo [address] [id number] Wal [phone]) 0  - [place]   11/01/2022 11/01/2022 1 Dextroamp-Amphet Er 30 Mg Cap 30.00 30 Bo [address] [id number] Wal [phone]) 0  - [place]   11/01/2022 11/01/2022 1 Dextroamp-Amphetamin 10 Mg Tab 60.00 30 Bo [address] [id number] Wal [phone]) 0  - [place]   10/04/2022 10/04/2022 1  Dextroamp-Amphet Er 30 Mg Cap 30.00 30 Bo Bon [id number] Wal [phone]) 0  - [place]   10/04/2022 10/04/2022 1 Dextroamp-Amphetamin 10 Mg Tab 60.00 30 Bo Bon 7732944 Wal [phone]) 0  - [place]   09/10/2022 08/02/2022 1 Dextroamp-Amphetamin 10 Mg Tab 30.00 30 [place] [place] [id number] Kro [phone]) 0  - [place]         Assessment  Terrance Irwin is a 34 y.o. male with psychiatric history of anxiety, depression, and ADHD presenting to clinic for f/u. The pt previously established at [facility] as an adolescent, was lost to follow up, re-established in the outpatient clinic in 2013, transitioned briefly to the addiction IOP clinic s/p DDU admit later in 2013, then was again lost to follow up. He reports most recently following w/ [facility] for ADHD and depression/anxiety. 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 The pt endorses extensive h/o prior substance abuse, but reports sobriety for the past 13+ years. Primary concerns remain related to ADHD, anxiety, and depression mostly during winter months. The pt most prominently exhibits sx consistent w/ ADHD - inattentive type and GAD w/ panic. Noted concerns for repeatedly being lost to follow up in the past, and potential difficulties w/ follow up in the future due to his job. The pt is cooperative and agreeable to at least every 3 month visits, annual in person visits, and regular urine drug screens. No current concern for depression sx. The pt tolerated re-initiation of Vyvanse well, but he reports better efficacy in the past at higher doses. Will plan to increase to 40 mg at this time.       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 Attention deficit hyperactivity disorder (ADHD), combined type  F90.2       2. Generalized anxiety disorder with panic attacks  F41.1 ECG 12-LEAD  F41.0       3. Trauma and stressor-related disorder  F43.9             Plan  Discussed risks, benefits, and alternatives. Patient agreeable to plan as described below.      - Increase Vyvanse to 40 mg daily for ADHD 02/23/24  - Discussed indications to consider increasing dose at future visits. Previous efficacy on 50 mg   - Urine drug screen 12/01/23 negative for everything  - Discussed need for 3 mo f/u, annual in-person visits, and regular urine drug screen  - Discussed plan for EKG, ordered today    - Discussed consideration for re-starting Pristiq, will defer at this time as the pt has no current depressive sx, however low threshold to start at future visits due to previous efficacy on this med    - Discussed consideration for re-trialing Wellbutrin in the future for nicotine  cessation    - Previously reviewed previous Genesight testing. Instructed pt to upload to MyChart.     Medication side effects discussed with patient.      Therapy:  - Supportive therapy provided in clinic today    Psychoeducation/Other:  - Safety: No acute safety concerns. Patient advised to report to nearest emergency department or to call 911 if having any suicidal or homicidal ideations.  - Patient advised to call the call center [phone]) with any questions or concerns.   - Also encouraged patient to use MyChart messaging for any non-urgent questions or concerns.  - This case was staffed with attending psychiatrist.    Follow up:  Return in about 3 months (around 05/23/2024) for In Person Visit.  Message will be sent to front desk  to schedule appointment for the above time frame.    Orders Placed This Encounter    ECG 12-LEAD    lisdexamfetamine  (VYVANSE) 40 mg Oral Capsule       Lonni Greet, DO 02/23/2024 17:47  PGY-3 Behavioral Medicine        Late entry for 02/23/24. I saw and examined the patient.  I reviewed the resident's note.  I agree with the findings and plan of care as documented in the resident's note.  Any exceptions/additions are edited/noted.    Dallas Single, MD         [1] No Known Allergies

## 2024-03-01 ENCOUNTER — Encounter (HOSPITAL_COMMUNITY): Payer: Self-pay | Admitting: PSYCHIATRY

## 2024-05-24 ENCOUNTER — Encounter (HOSPITAL_COMMUNITY): Payer: Self-pay | Admitting: PSYCHIATRY
